# Patient Record
Sex: Female | Born: 1990 | Hispanic: Yes | Marital: Single | Smoking: Never smoker
Health system: Southern US, Community
[De-identification: ages and names within clinical notes are randomized; demographics above are authoritative.]

## PROBLEM LIST (undated history)

## (undated) DIAGNOSIS — Z789 Other specified health status: Secondary | ICD-10-CM

## (undated) HISTORY — PX: NO PAST SURGERIES: SHX2092

---

## 2010-05-19 HISTORY — PX: OTHER SURGICAL HISTORY: SHX169

## 2011-03-16 ENCOUNTER — Inpatient Hospital Stay (HOSPITAL_COMMUNITY): Payer: Self-pay

## 2011-03-16 ENCOUNTER — Inpatient Hospital Stay (HOSPITAL_COMMUNITY)
Admission: AD | Admit: 2011-03-16 | Discharge: 2011-03-16 | Disposition: A | Discharge status: Home / Self Care | Payer: Self-pay | Source: Ambulatory Visit | Attending: Obstetrics & Gynecology | Admitting: Obstetrics & Gynecology

## 2011-03-16 ENCOUNTER — Encounter (HOSPITAL_COMMUNITY): Payer: Self-pay | Admitting: *Deleted

## 2011-03-16 DIAGNOSIS — O2390 Unspecified genitourinary tract infection in pregnancy, unspecified trimester: Secondary | ICD-10-CM | POA: Insufficient documentation

## 2011-03-16 DIAGNOSIS — N760 Acute vaginitis: Secondary | ICD-10-CM | POA: Insufficient documentation

## 2011-03-16 DIAGNOSIS — O209 Hemorrhage in early pregnancy, unspecified: Secondary | ICD-10-CM | POA: Insufficient documentation

## 2011-03-16 DIAGNOSIS — B9689 Other specified bacterial agents as the cause of diseases classified elsewhere: Secondary | ICD-10-CM | POA: Insufficient documentation

## 2011-03-16 DIAGNOSIS — A499 Bacterial infection, unspecified: Secondary | ICD-10-CM | POA: Insufficient documentation

## 2011-03-16 LAB — CBC
MCHC: 35.3 g/dL (ref 30.0–36.0)
Platelets: 317 10*3/uL (ref 150–400)
RDW: 12.2 % (ref 11.5–15.5)
WBC: 12.5 10*3/uL — ABNORMAL HIGH (ref 4.0–10.5)

## 2011-03-16 LAB — HCG, QUANTITATIVE, PREGNANCY: hCG, Beta Chain, Quant, S: 2761 m[IU]/mL — ABNORMAL HIGH (ref <5)

## 2011-03-16 LAB — WET PREP, GENITAL
Trich, Wet Prep: NONE SEEN
Yeast Wet Prep HPF POC: NONE SEEN

## 2011-03-16 MED ORDER — METRONIDAZOLE 500 MG PO TABS: 500.0000 mg | ORAL_TABLET | Freq: Two times a day (BID) | ORAL | Status: AC

## 2011-03-16 NOTE — Progress Notes (Signed)
Pt reports per interpreter, that this am she had a brownish discharge, nothing after that until tonight when she noticed a drop of blood. Pt reports positive preg  Test on 03/13/2011, LMP 12/27/2010. Pt denies pain. Pt is going to go thru [facility] for prenatal care

## 2011-03-16 NOTE — ED Provider Notes (Signed)
Chief Complaint:  Vaginal Bleeding and Possible Pregnancy   Molly Carter is  20 y.o. G1P0.  Patient's last menstrual period was 12/27/2010.Marland Kitchen  Her pregnancy status is positive. Presents with proof of preg letter from Dr. Jearl Klinefelter office. [redacted]w[redacted]d by LMP She presents complaining of Vaginal Bleeding and Possible Pregnancy . Onset is described as sudden and has been present for today. Reports pink/red spotting on tissue only after voiding. Denies abd pain, back pain and dysuria. Last intercourse yesterday.  Obstetrical/Gynecological History: OB History    Grav Para Term Preterm Abortions TAB SAB Ect Mult Living   1               Past Medical History: No past medical history on file.  Past Surgical History: No past surgical history on file.  Family History: No family history on file.  Social History: History  Substance Use Topics  . Smoking status: Not on file  . Smokeless tobacco: Not on file  . Alcohol Use: Not on file    Allergies: No Known Allergies  No prescriptions prior to admission    Review of Systems - Negative except what has been reviewed in the HPI  Physical Exam   Blood pressure 140/79, pulse 102, temperature 97.9 F (36.6 C), resp. rate 16, height 5\' 3"  (1.6 m), weight 74.39 kg (164 lb), last menstrual period 12/27/2010, SpO2 99.00%.  General: General appearance - alert, well appearing, and in no distress, oriented to person, place, and time and overweight Mental status - alert, oriented to person, place, and time, normal mood, behavior, speech, dress, motor activity, and thought processes, affect appropriate to mood Abdomen - soft, nontender, nondistended, no masses or organomegaly Focused Gynecological Exam: VULVA: normal appearing vulva with no masses, tenderness or lesions, VAGINA: vaginal discharge - scant brown disharge, CERVIX: normal appearing cervix without discharge or lesions, UTERUS: enlarged to 7-8 week's size, ADNEXA: normal adnexa in  size, nontender and no masses  Labs: Recent Results (from the past 24 hour(s))  HCG, QUANTITATIVE, PREGNANCY   Collection Time   03/16/11  9:50 PM      Component Value Range   hCG, Beta Chain, Quant, S 2761 (*) <5 (mIU/mL)  CBC   Collection Time   03/16/11  9:50 PM      Component Value Range   WBC 12.5 (*) 4.0 - 10.5 (K/uL)   RBC 4.10  3.87 - 5.11 (MIL/uL)   Hemoglobin 13.3  12.0 - 15.0 (g/dL)   HCT 16.1  09.6 - 04.5 (%)   MCV 92.0  78.0 - 100.0 (fL)   MCH 32.4  26.0 - 34.0 (pg)   MCHC 35.3  30.0 - 36.0 (g/dL)   RDW 40.9  81.1 - 91.4 (%)   Platelets 317  150 - 400 (K/uL)  ABO/RH   Collection Time   03/16/11  9:56 PM      Component Value Range   ABO/RH(D) O POS    WET PREP, GENITAL   Collection Time   03/16/11 10:10 PM      Component Value Range   Yeast, Wet Prep NONE SEEN  NONE SEEN    Trich, Wet Prep NONE SEEN  NONE SEEN    Clue Cells, Wet Prep MODERATE (*) NONE SEEN    WBC, Wet Prep HPF POC MANY (*) NONE SEEN    Imaging Studies:  RADIOLOGY REPORT*  Clinical Data: Bleeding. Gestational age by LMP 11-week-2-day  OBSTETRIC <14 WK [place] AND TRANSVAGINAL OB US  Technique: Both transabdominal and transvaginal  ultrasound  examinations were performed for complete evaluation of the  gestation as well as the maternal uterus, adnexal regions, and  pelvic cul-de-sac. Transvaginal technique was performed to assess  early pregnancy.  Comparison: None.  Intrauterine gestational sac: Irregular shape gestational sac  containing echoes in the fluid.  Yolk sac: not visualized  Embryo: Not visualized  Cardiac Activity: Not visualized  MSD: 33.7 mm mm 8 w 6 d  CRL: mm w d [place] EDC:  Maternal uterus/adnexae:  Small amount of free fluid. Corpus luteum cyst right ovary.  Ovaries are normal in size.  IMPRESSION:  Gestational sac shape is irregular with internal echoes. No yolk  sac or fetal pole. Findings suggest fetal demise or blighted ovum.  Follow-up Beta HCG is suggested.    Original Report Authenticated By: Camelia Phenes, M.D.    Assessment: Bleeding in early pregnancy BV  Plan: Discharge home Rx Flagyl FU 48 hours for repeat quant Bleeding/ectopic precautions reviewed  Krystofer Hevener E. 03/16/2011,10:52 PM

## 2011-03-16 NOTE — Progress Notes (Signed)
Pt woke up this morning with brown discharge and then it turned pink this afternoon.  States she had intercourse yesterday.  Denies any pain with urination; no cramping.

## 2011-03-18 ENCOUNTER — Encounter (HOSPITAL_COMMUNITY): Payer: Self-pay | Admitting: *Deleted

## 2011-03-18 ENCOUNTER — Inpatient Hospital Stay (HOSPITAL_COMMUNITY)
Admission: AD | Admit: 2011-03-18 | Discharge: 2011-03-18 | Disposition: A | Discharge status: Home / Self Care | Payer: Self-pay | Source: Ambulatory Visit | Attending: Obstetrics and Gynecology | Admitting: Obstetrics and Gynecology

## 2011-03-18 ENCOUNTER — Inpatient Hospital Stay (HOSPITAL_COMMUNITY): Payer: Self-pay

## 2011-03-18 ENCOUNTER — Ambulatory Visit (HOSPITAL_COMMUNITY): Payer: Self-pay

## 2011-03-18 ENCOUNTER — Other Ambulatory Visit: Payer: Self-pay | Admitting: Obstetrics and Gynecology

## 2011-03-18 DIAGNOSIS — O021 Missed abortion: Secondary | ICD-10-CM

## 2011-03-18 DIAGNOSIS — O209 Hemorrhage in early pregnancy, unspecified: Secondary | ICD-10-CM

## 2011-03-18 DIAGNOSIS — O039 Complete or unspecified spontaneous abortion without complication: Secondary | ICD-10-CM | POA: Insufficient documentation

## 2011-03-18 HISTORY — DX: Other specified health status: Z78.9

## 2011-03-18 MED ORDER — IBUPROFEN 600 MG PO TABS: 600.0000 mg | ORAL_TABLET | Freq: Four times a day (QID) | ORAL | Status: AC | PRN

## 2011-03-18 NOTE — ED Provider Notes (Signed)
History   Chief Complaint:  Vaginal Bleeding   Molly Carter is  20 y.o. G1P0 Patient's last menstrual period was 12/27/2010.Marland Kitchen Patient is here for follow up of quantitative HCG and ongoing surveillance of pregnancy status.   She is [redacted]w[redacted]d weeks gestation by LMP.    Since her last visit, the patient is with new complaint.   The patient reports increased low abd pain that prompted her to return to [facility]. She started bleeding a moderate amount of bright red blood and passed one medium clot.  Pain significantly improved immediately afterward.  General ROS:  negative  Her previous Quantitative HCG values are HCG, QUANTITATIVE, PREGNANCY   Collection Time   03/16/11  9:50 PM      Component Value Range   hCG, Beta Chain, Quant, S 2761 (*) <5 (mIU/mL)   HCG, QUANTITATIVE, PREGNANCY   Collection Time   03/18/11  5:32 AM      Component Value Range   hCG, Beta Chain, Quant, S 1550 (*) <5 (mIU/mL)   Physical Exam   Blood pressure 133/82, pulse 115, temperature 99.3 F (37.4 C), resp. rate 20, last menstrual period 12/27/2010.  Focused Gynecological Exam: exam declined by the patient  Ultrasound Studies:   US Ob Transvaginal  03/18/2011  *RADIOLOGY REPORT*  Clinical Data: Heavy bleeding  TRANSVAGINAL OB ULTRASOUND  Technique:  Transvaginal ultrasound was performed for evaluation of the gestation as well as the maternal uterus and adnexal regions.  Comparison: 03/16/2011  Findings: The uterus measures 5.5 x 9.6 by 6.9 cm.  The endometrium is thickened and heterogeneous in echotexture, measuring about 1.5 cm thickness.  The previously identified endometrial gestational sac is not visualized today.  Changes are consistent with ongoing or interval spontaneous abortion.  Color flow Doppler images demonstrate minimal if any flow in the endometrium, suggesting no specific evidence of retained products of conception.  No myometrial masses are suggested.  No free pelvic fluid collections.  The  right ovary measures 3.5 by 2.6 by 2.8 cm and contains a simple cyst measuring 1.9 x 1.4 cm.  The left ovary measures 2.7 x 1.4 by 1.9 cm.  Normal follicular changes are suggested.  No abnormal adnexal masses.  IMPRESSION: Previously identified intrauterine gestational sac is no longer visualized.  The endometrium is heterogeneous and thickened. Changes are consistent with ongoing/interval spontaneous abortion.  Original Report Authenticated By: Marlon Pel, M.D.   Possible GS passed while in [place].   Assessment: 1. Completed SAB, bleeding stable  Plan: 1. The patient is instructed to follow PRN for heavy bleeding or fever >100.4. 2. Support given 3. Preconception counseling done. 4. POC to pathology    Molly Carter 03/18/2011, 5:49 AM

## 2011-03-18 NOTE — Progress Notes (Signed)
Pt presents and per interpreter complains of increasing pain in lower abd tonight, was seen 10/28 for bleeding and no pain, was told to return on 10/31 for repeat labs. enroute here began to have heavy bleeding

## 2011-03-18 NOTE — ED Provider Notes (Signed)
Agree with above note.  Molly Carter H. 03/18/2011 7:50 PM

## 2011-03-20 NOTE — ED Provider Notes (Signed)
Agree with above note.  Molly Carter 03/20/2011 3:41 PM

## 2012-03-29 ENCOUNTER — Ambulatory Visit: Payer: Self-pay | Admitting: Family Medicine

## 2012-03-29 VITALS — BP 135/75 | HR 79 | Temp 97.8°F | Resp 16 | Ht 64.0 in | Wt 157.0 lb

## 2012-03-29 DIAGNOSIS — W57XXXA Bitten or stung by nonvenomous insect and other nonvenomous arthropods, initial encounter: Secondary | ICD-10-CM

## 2012-03-29 DIAGNOSIS — T148 Other injury of unspecified body region: Secondary | ICD-10-CM

## 2012-03-29 MED ORDER — HYDROXYZINE HCL 25 MG PO TABS: 25.0000 mg | ORAL_TABLET | Freq: Three times a day (TID) | ORAL | Status: DC | PRN

## 2012-03-29 MED ORDER — TRIAMCINOLONE ACETONIDE 0.1 % EX CREA: TOPICAL_CREAM | Freq: Two times a day (BID) | CUTANEOUS | Status: DC

## 2012-03-29 NOTE — Patient Instructions (Signed)
Bedbugs Bedbugs are tiny bugs that live in and around beds. During the day, they hide in mattresses and other places near beds. They come out at night and bite people lying in bed. They need blood to live and grow. Bedbugs can be found in beds anywhere. Usually, they are found in places where many people come and go (hotels, shelters, hospitals). It does not matter whether the place is dirty or clean. Getting bitten by bedbugs rarely causes a medical problem. The biggest problem can be getting rid of them. This often takes the work of a pest control expert. CAUSES  Less use of pesticides. Bedbugs were common before the 1950s. Then, strong pesticides such as DDT nearly wiped them out. Today, these pesticides are not used because they harm the environment and can cause health problems.  More travel. Besides mattresses, bedbugs can also live in clothing and luggage. They can come along as people travel from place to place. Bedbugs are more common in certain parts of the world. When people travel to those areas, the bugs can come home with them.  Presence of birds and bats. Bedbugs often infest birds and bats. If you have these animals in or near your home, bedbugs may infest your house, too. SYMPTOMS It does not hurt to be bitten by a bedbug. You will probably not wake up when you are bitten. Bedbugs usually bite areas of the skin that are not covered. Symptoms may show when you wake up, or they may take a day or more to show up. Symptoms may include:  Small red bumps on the skin. These might be lined up in a row or clustered in a group.  A darker red dot in the middle of red bumps.  Blisters on the skin. There may be swelling and very bad itching. These may be signs of an allergic reaction. This does not happen often. DIAGNOSIS Bedbug bites might look and feel like other types of insect bites. The bugs do not stay on the body like ticks or lice. They bite, drop off, and crawl away to hide. Your  caregiver will probably:  Ask about your symptoms.  Ask about your recent activities and travel.  Check your skin for bedbug bites.  Ask you to check at home for signs of bedbugs. You should look for:  Spots or stains on the bed or nearby. This could be from bedbugs that were crushed or from their eggs or waste.  Bedbugs themselves. They are reddish-brown, oval, and flat. They do not fly. They are about the size of an apple seed.  Places to look for bedbugs include:  Beds. Check mattresses, headboards, box springs, and bed frames.  On drapes and curtains near the bed.  Under carpeting in the bedroom.  Behind electrical outlets.  Behind any wallpaper that is peeling.  Inside luggage. TREATMENT Most bedbug bites do not need treatment. They usually go away on their own in a few days. The bites are not dangerous. However, treatment may be needed if you have scratched so much that your skin has become infected. You may also need treatment if you are allergic to bedbug bites. Treatment options include:  A drug that stops swelling and itching (corticosteroid). Usually, a cream is rubbed on the skin. If you have a bad rash, you may be given a corticosteroid pill.  Oral antihistamines. These are pills to help control itching.  Antibiotic medicines. An antibiotic may be prescribed for infected skin. HOME CARE INSTRUCTIONS     Take any medicine prescribed by your caregiver for your bites. Follow the directions carefully.  Consider wearing pajamas with long sleeves and pant legs.  Your bedroom may need to be treated. A pest control expert should make sure the bedbugs are gone. You may need to throw away mattresses or luggage. Ask the pest control expert what you can do to keep the bedbugs from coming back. Common suggestions include:  Putting a plastic cover over your mattress.  Washing and drying your clothes and bedding in hot water and a hot dryer. The temperature should be hotter  than 120 F (48.9 C). Bedbugs are killed by high temperatures.  Vacuuming carefully all around your bed. Vacuum in all cracks and crevices where the bugs might hide. Do this often.  Carefully checking all used furniture, bedding, or clothes that you bring into your house.  Eliminating bird nests and bat roosts.  If you get bedbug bites when traveling, check all your possessions carefully before bringing them into your house. If you find any bugs on clothes or in your luggage, consider throwing those items away. SEEK MEDICAL CARE IF:  You have red bug bites that keep coming back.  You have red bug bites that itch badly.  You have bug bites that cause a skin rash.  You have scratch marks that are red and sore. SEEK IMMEDIATE MEDICAL CARE IF: You have a fever. Document Released: 06/07/2010 Document Revised: 07/28/2011 Document Reviewed: 06/07/2010 [facility] Patient Information 2013 [place], [place]. Chinches (Bedbugs)  Las chinches son insectos pequeos que viven en y alrededor de las camas. Durante el da se ocultan en colchones y otros lugares cerca de las camas. Por la noche salen y pican a los que duermen. Necesitan sangre para vivir y Engineer, building services. Pueden encontrarse en las camas de Corporate treasurer. Por lo general, se encuentran en lugares donde muchas personas Zenaida Niece y vienen (hoteles, albergues, hospitales). No importa si el lugar est sucio o limpio.  La picadura de chinches rara vez causa problemas de [place]. El mayor problema es deshacerse de ellos.  Generalmente requiere la intervencin de un experto en control de plagas. CAUSAS   Menos uso de pesticidas. Las chinches eran una plaga comn antes de la dcada de 1950. En ese entonces, los pesticidas fuertes, como el DDT estuvo a punto eliminarlas. Actualmente ya no se [place] plaguicidas, ya que daan [address] y pueden causar problemas de [place].   Se viaja ms. Adems de los colchones, las chinches tambin pueden vivir en  la ropa y el equipaje. Pueden venir con las personas que viajan de Administrator a Therapist, art. Las chinches son ms comunes en ciertas partes del mundo. Cuando la gente viaja a esas zonas, los insectos pueden volver a casa con ellos.   Presencia de aves y Regulatory affairs officer. Las chinches suelen infestar a las aves y Sales executive. Si usted tiene [employer] alrededores de su casa, las chinches pueden infestar su casa, [place].  SNTOMAS  La picadura de una chinche no duele. Probablemente no lo despierte. Las chinches pican las reas de la piel descubiertas. Los sntomas pueden aparecer al despertar, o despus de uno o ms das. Los sntomas pueden ser:   Pequeos bultos rojos en la piel. Estos pueden ser [employer] fila o agrupado.   Un punto rojo ms oscuro en el centro de protuberancias rojas.   Ampollas en la piel. Puede haber inflamacin y picazn muy intensa. Estos pueden ser sntomas de Runner, broadcasting/film/video. Esto  no sucede a menudo.  DIAGNSTICO  Las picaduras de chinches puede verse y sentirse como otros tipos de picaduras de insectos. Los insectos no se quedan adheridos al cuerpo, [employer] garrapatas o piojos. Muerden, se caen, y se arrastran a esconderse. Su mdico probablemente har lo siguiente:   Economist de sus sntomas.   Preguntar sobre sus actividades recientes y los viajes.   Reviser en su piel las picaduras.   Le pedir que investigue en su casa en busca de signos de chinches. Usted debe buscar:   Manchas o puntos en la cama o en los alrededores. Pueden ser las chinches aplastadas, sus huevos o los desechos.   Las [employer]. Son de color marrn rojizo, forma ovalada y plana. No vuelan Son aproximadamente del [address] de [place] semilla de [place].   Los lugares para buscar chinches son:   Camas. Revise colchones, cabeceras, somiers y [employer] de la cama.   Cortinados en los alrededores de la cama.   Debajo de la alfombra del dormitorio.   Detrs de los  [employer].   Detrs del papel pintado que se est despegando.   Dentro de equipaje.  TRATAMIENTO  La mayora de las picaduras no necesitan antibiticos. Desaparecen sin tratamiento luego de [employer]. Las picaduras no son peligrosas. Sin embargo, puede ser necesario un tratamiento si usted se ha rascado tanto que su piel se ha infectado. Tambin puede necesitar un tratamiento si es alrgico a las picaduras de las chinches. Las opciones de tratamiento son:   Un medicamento que detiene la inflamacin y Higher education careers adviser (corticoides). Por lo general, se frota una crema sobre la piel. Si usted tiene una erupcin grave, podrn recetarle una pastilla de corticooides.   Antihistamnicos por va oral. Estas pastillas ayudan a controlar la picazn.   Antibiticos. Si la piel se infecta le recetarn antibiticos.  INSTRUCCIONES PARA EL CUIDADO EN EL HOGAR   Tome los medicamentos como le indic el mdico. Siga cuidadosamente las indicaciones.   Use pijamas con mangas y piernas largas.   Podra ser necesario un tratamiento en la habitacin. Un experto en plagas deber verificar que las chinches han desaparecido. Puede ser que deba desechar el colchn. Consulte con un experto qu puede hacer para prevenir que vuelva la plaga. Entre las sugerencias ms comunes se incluyen:   Coloque una cubierta plstica sobre el colchn.   Lave y seque sus ropas y ropa de cama en agua caliente y en secador con calor. La temperatura debe ser de ms de 120 F (48.9 C). Las altas [employer] las chinches.   Pase la aspiradora cuidadosamente alrededor [employer]. Aspire en todas las grietas y escondites en que las chinches puedan ocultarse. Hgalo con frecuencia.   Controle todos los [place], camas o ropa de cama usados antes de llevarlos a su casa.   Elimine los nidos de pjaros y perchas de los murcilagos.   Si las chinches lo pican durante un viaje controle todas sus pertenencias con cuidados antes de  llevarlas a su casa. Si encuentra chinches en la ropa o en el equipaje, considere la posibilidad de desecharlos.  SOLICITE ATENCIN MDICA SI:   Tiene picaduras rojas que vuelven a aparecer.   Siente una picazn intensa en las ronchas.   Las picaduras le causan una erupcin en la piel.   Tiene marcas de rascado que estn rojas y Secretary/administrator.  SOLICITE ATENCIN MDICA DE INMEDIATO SI:  Tiene fiebre.  Document Released: 01/15/2011 Document Revised: 04/24/2011 ExitCare  Patient Information 2012 [employer]. 

## 2012-03-29 NOTE — Progress Notes (Signed)
  Subjective:    Patient ID: Molly Carter, female    DOB: 05/14/1991, 21 y.o.   MRN: [id number]  HPI Has had intermittent rash x 2 yrs.  Has tried permethrin once and hydrocortisone which helped ot control the itching but it didn't really solve the problem.  Is really concerned that now she is getting permanent dark spots on the extensor surface of her arms so wants something to lighten the spots.  Is also over her thighs and some spots on her chest/upper back Was told that it was scabies once so washed everything in hot water when she did the permetherin but didn't seem to help.  She is here w/ a friend who sleeps over sometimes and the friend has never gotten the itchy rash or noone else in the phone. Past Medical History  Diagnosis Date  . No pertinent past medical history    No h/o asthma or allergies Review of Systems  Constitutional: Negative for fever, chills and diaphoresis.  Musculoskeletal: Negative for joint swelling and arthralgias.  Skin: Positive for color change and rash. Negative for pallor and wound.  Hematological: Negative for adenopathy. Does not bruise/bleed easily.  Psychiatric/Behavioral: Negative for sleep disturbance.      BP 135/75  Pulse 79  Temp 97.8 F (36.6 C) (Oral)  Resp 16  Ht 5\' 4"  (1.626 m)  Wt 157 lb (71.215 kg)  BMI 26.95 kg/m2  SpO2 100%  LMP 03/17/2012  Breastfeeding? Unknown Objective:   Physical Exam  Constitutional: She is oriented to person, place, and time. She appears well-developed and well-nourished. No distress.  HENT:  Head: Normocephalic and atraumatic.  Eyes: Conjunctivae normal are normal. No scleral icterus.  Pulmonary/Chest: Effort normal.  Musculoskeletal: She exhibits no edema.  Neurological: She is alert and oriented to person, place, and time.  Skin: Skin is warm and dry. Lesion and rash noted. She is not diaphoretic.       Many hyperpigemted macules and excoriated papules over bilateral extensor surface of  arms around elbow  Psychiatric: She has a normal mood and affect. Her behavior is normal.          Assessment & Plan:   1. Bedbug bite  triamcinolone cream (KENALOG) 0.1 %, hydrOXYzine (ATARAX/VISTARIL) 25 MG tablet  Gave pt home info on how to look for and rid living place of bedbugs.  Top steroid will at least help with the itching and may also lighten hyperpigmented macules which I suspect are from recurrent inflammation from excoriation.

## 2012-05-19 NOTE — L&D Delivery Note (Signed)
Delivery Note At 2:17 AM a viable female was delivered via Vaginal, Spontaneous Delivery (Presentation: ; Occiput Anterior).  APGAR: 9, 9; weight .   Placenta status: Intact, Spontaneous.  Cord: 3 vessels with the following complications: None.  Cord pH: not done  Anesthesia: Epidural  Episiotomy: None Lacerations: 1st degree;Sulcus;Perineal Suture Repair: 2.0 vicryl Est. Blood Loss (mL): 250  Mom to postpartum.  Baby to Couplet care / Skin to Skin.  MARSHALL,BERNARD A 05/10/2013, 2:39 AM

## 2012-06-21 IMAGING — US US OB TRANSVAGINAL
1 series · 14 of 28 positions shown · non-contrast
Comparison: None.

CLINICAL DATA: Bleeding.  Gestational age by LMP 11-week-1-day

OBSTETRIC <14 WK US AND TRANSVAGINAL OB US
TECHNIQUE: Both transabdominal and transvaginal ultrasound
examinations were performed for complete evaluation of the
gestation as well as the maternal uterus, adnexal regions, and
pelvic cul-de-sac.  Transvaginal technique was performed to assess
early pregnancy.

[Series 1: us ob comp less 14 wks · 14 of 38 slices shown]
[im 2/38]
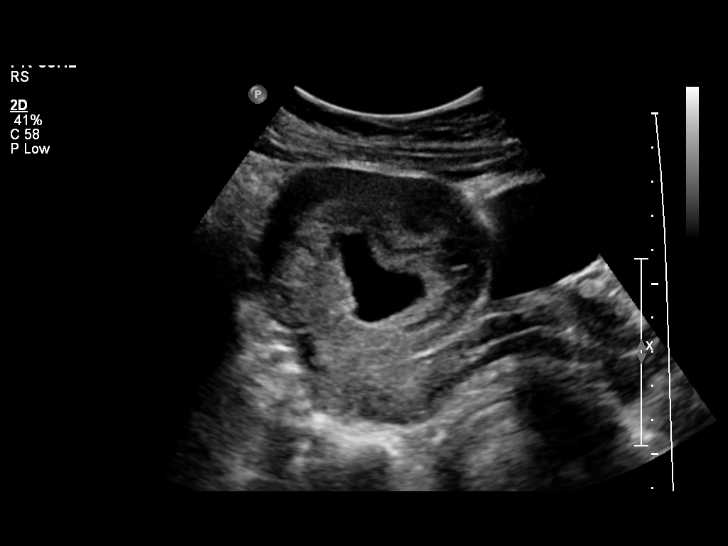
[im 5/38]
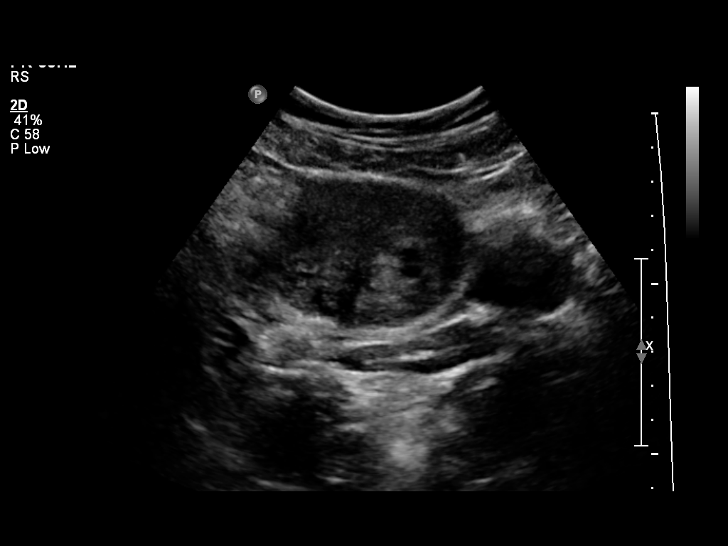
[im 7/38]
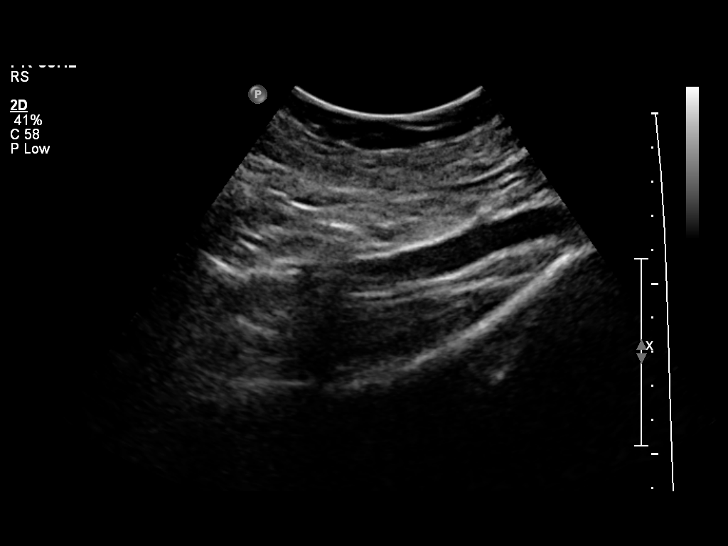
[im 10/38]
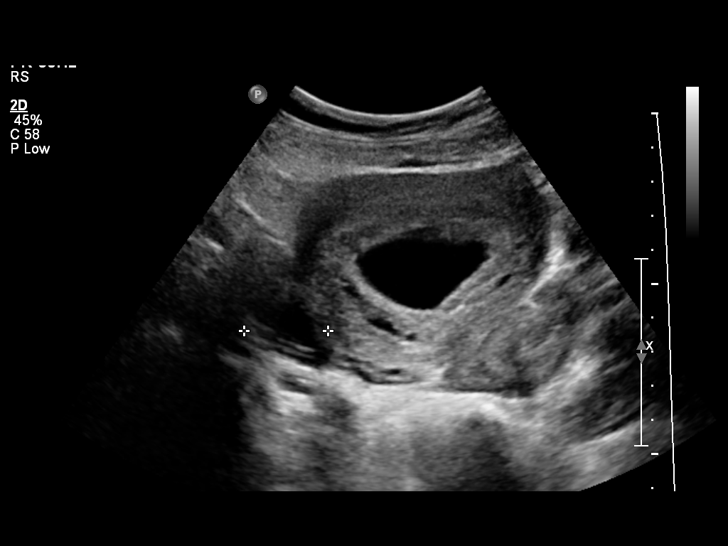
[im 13/38]
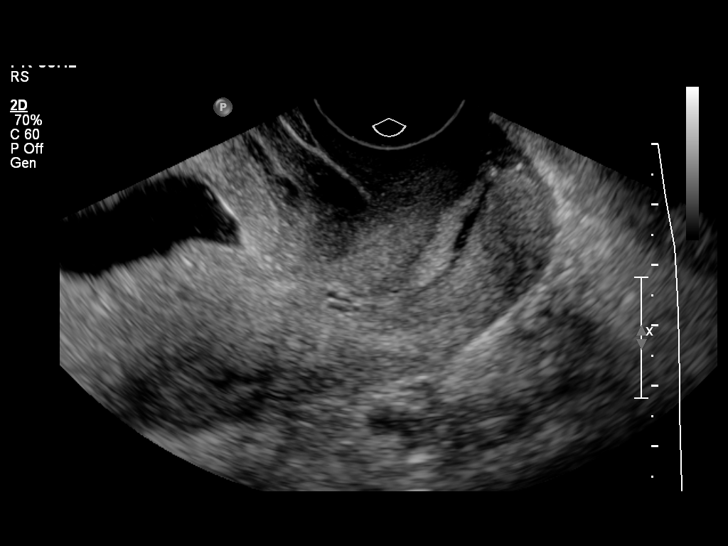
[im 16/38]
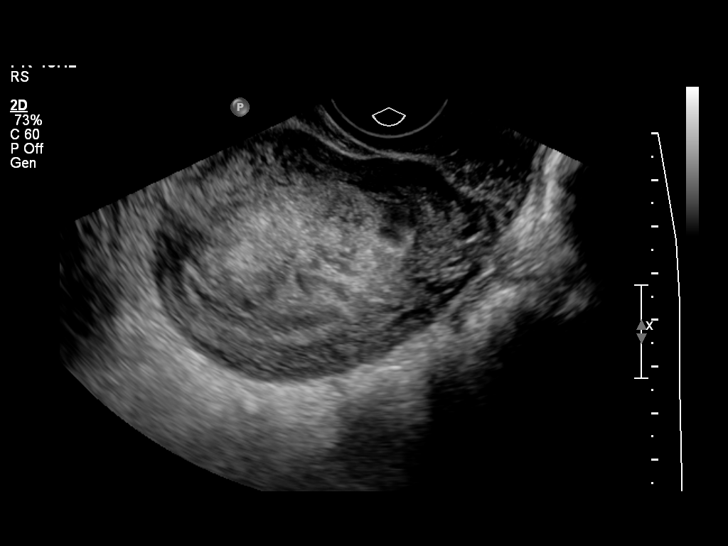
[im 18/38]
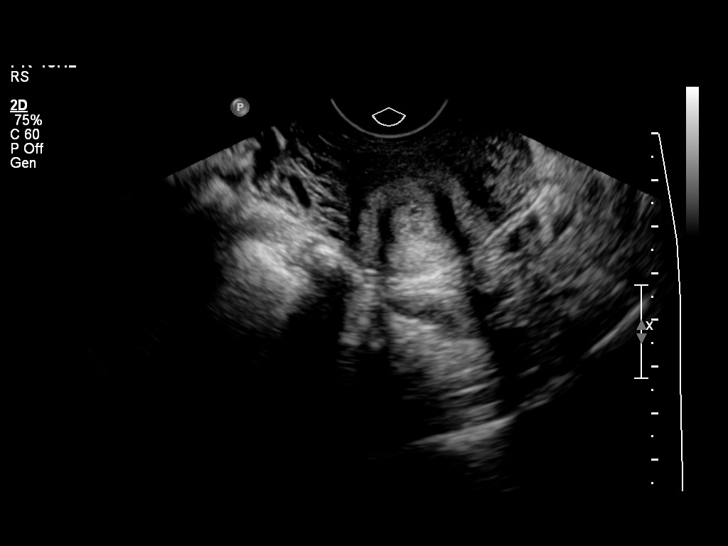
[im 21/38]
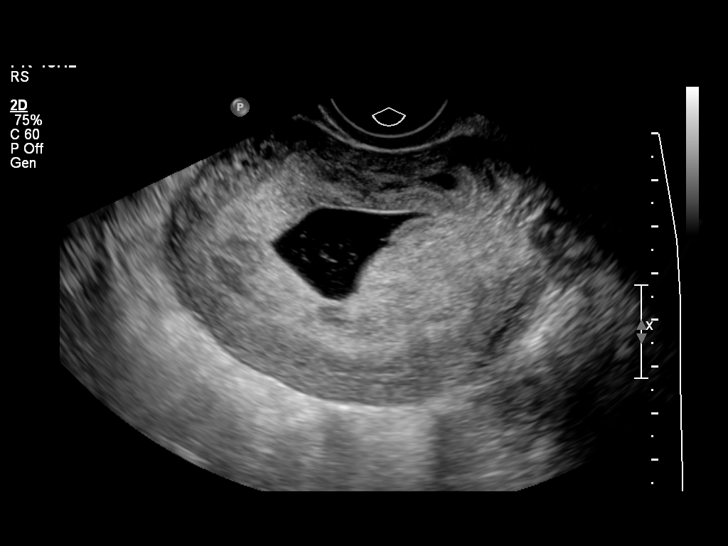
[im 24/38]
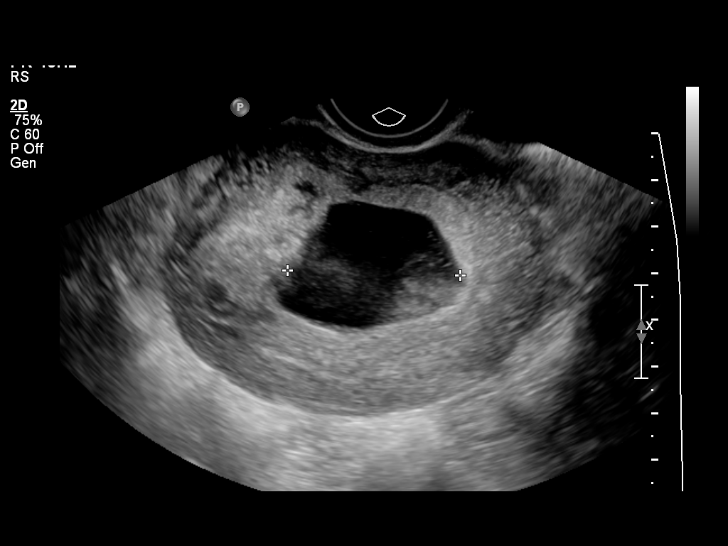
[im 27/38]
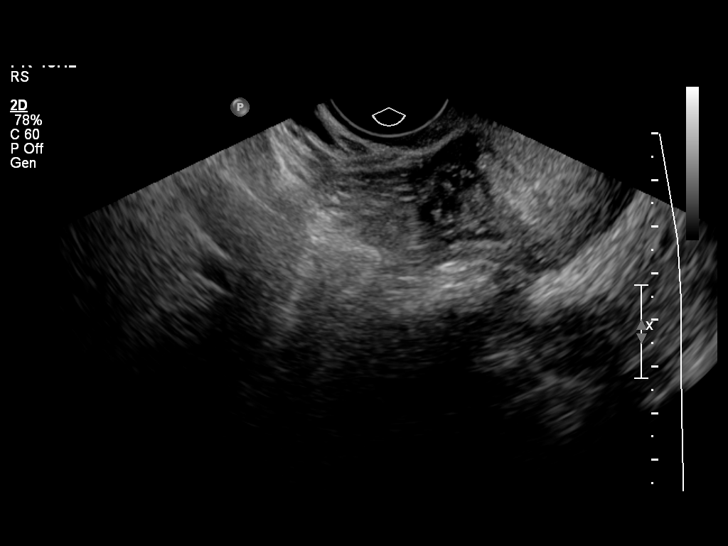
[im 29/38]
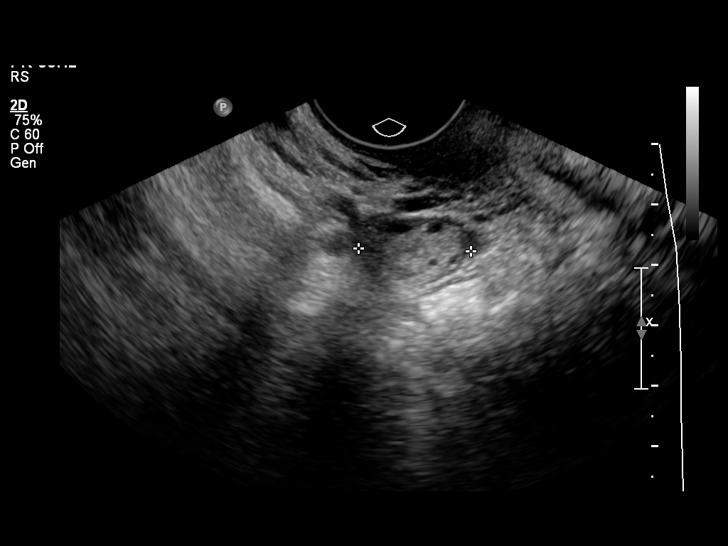
[im 32/38]
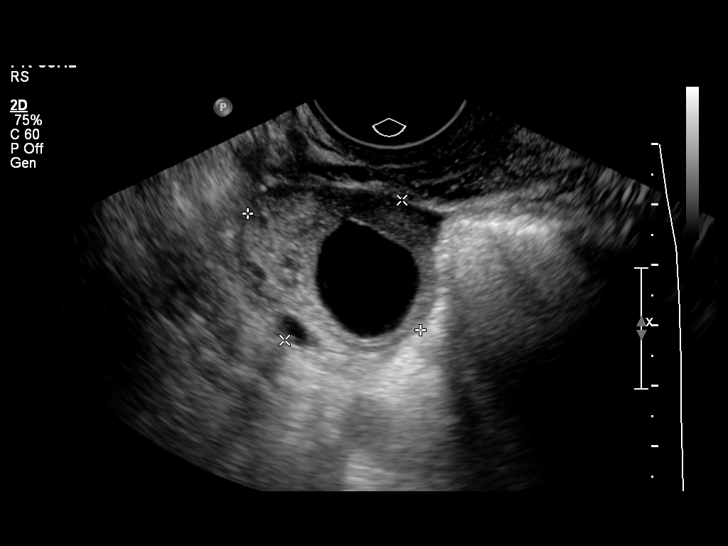
[im 35/38]
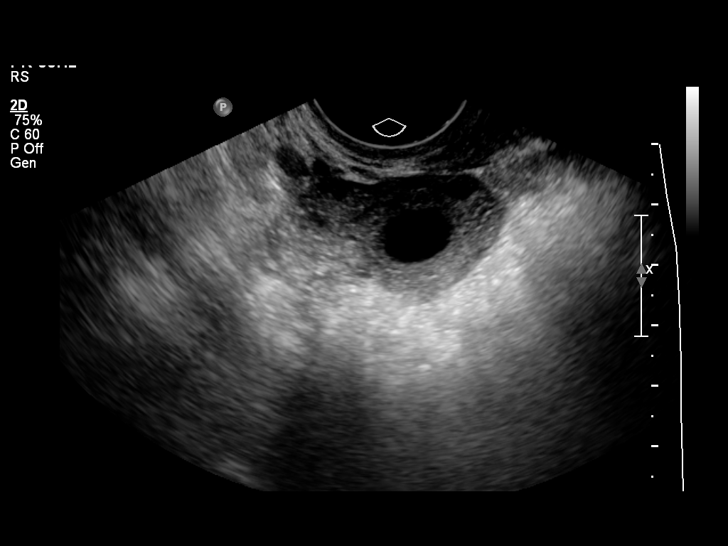
[im 38/38]
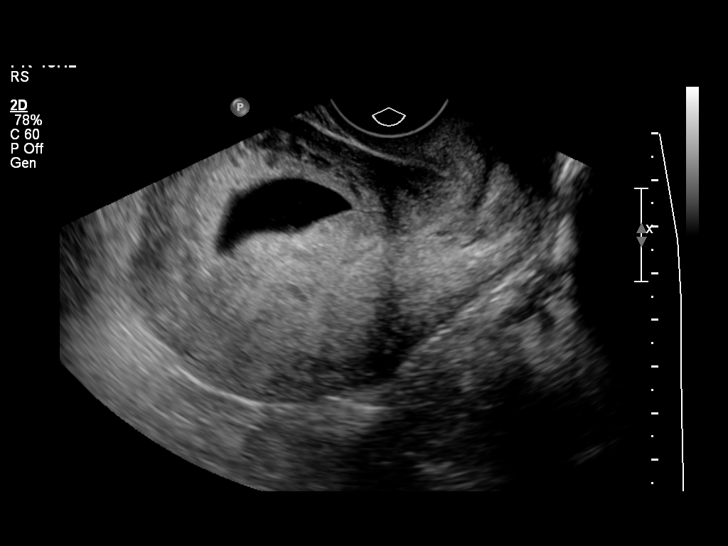

[14 of 28 positions shown; findings below may reference images not displayed]

Intrauterine gestational sac:  Irregular shape gestational sac
containing echoes  in the fluid.
Yolk sac:   not visualized
Embryo: Not visualized
Cardiac Activity: Not visualized

MSD: 33.7 mm  mm  8    w 6    d
CRL:    mm     w     d        US EDC:

Maternal uterus/adnexae:
Small amount of free fluid.  Corpus luteum cyst right ovary.
Ovaries are normal in size.
IMPRESSION: Gestational sac shape is irregular with internal echoes.  No yolk
sac or fetal pole.  Findings suggest fetal demise or blighted ovum.
Follow-up Beta HCG is suggested.

## 2012-06-23 IMAGING — US US OB TRANSVAGINAL
1 series · 14 of 28 positions shown · non-contrast
Comparison: 03/16/2011

CLINICAL DATA: Heavy bleeding

TRANSVAGINAL OB ULTRASOUND
TECHNIQUE: Transvaginal ultrasound was performed for evaluation of
the gestation as well as the maternal uterus and adnexal regions.

[Series 1: us ob transvaginal · 14 of 42 slices shown]
[im 2/42]
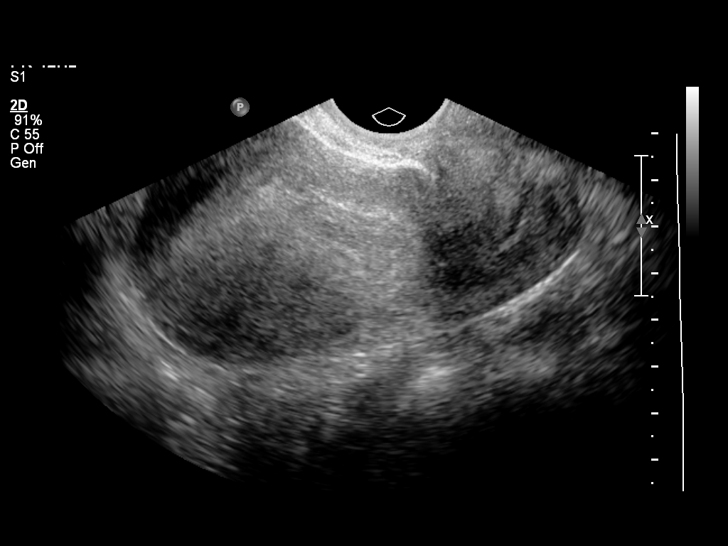
[im 5/42]
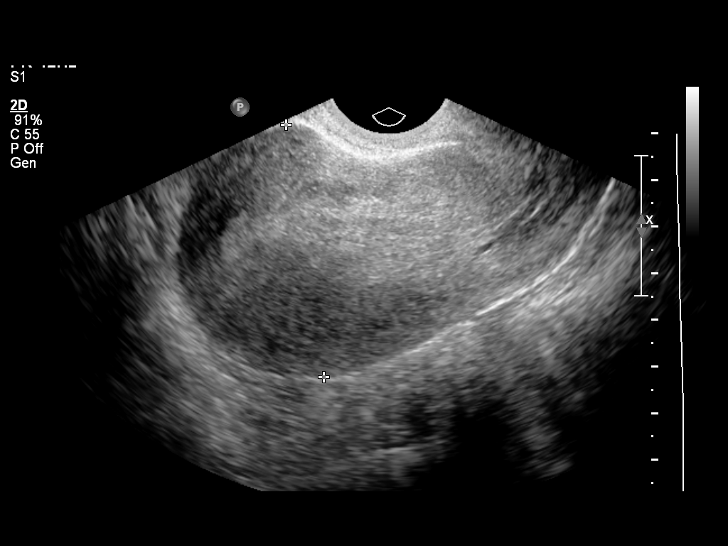
[im 8/42]
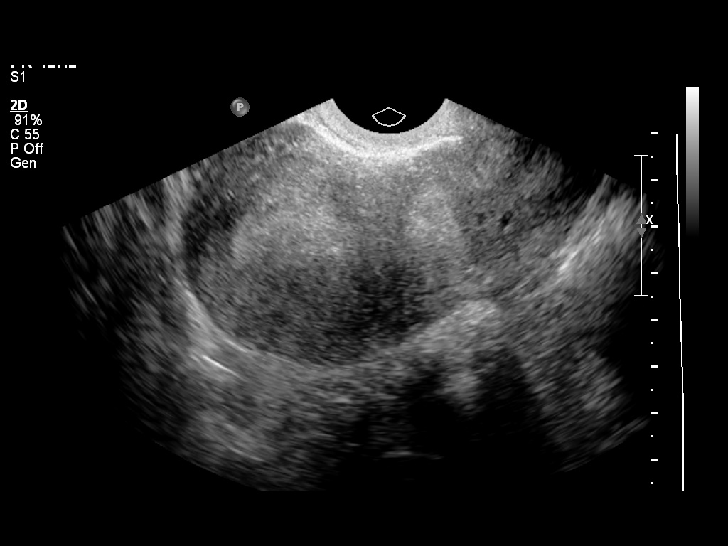
[im 11/42]
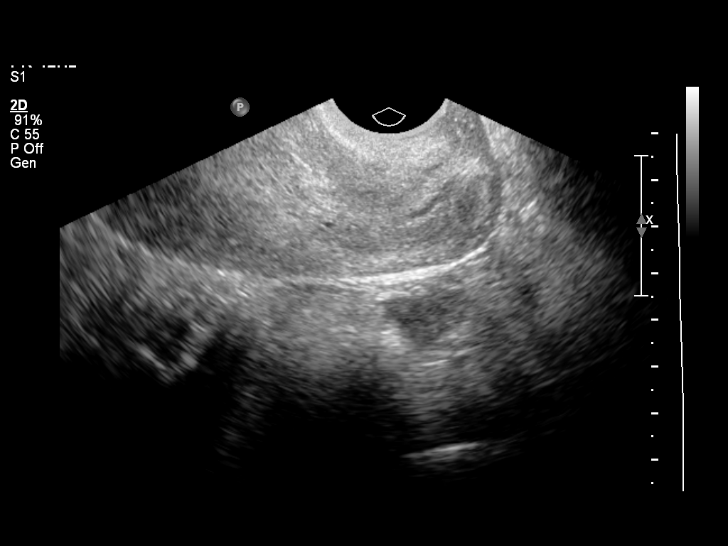
[im 14/42]
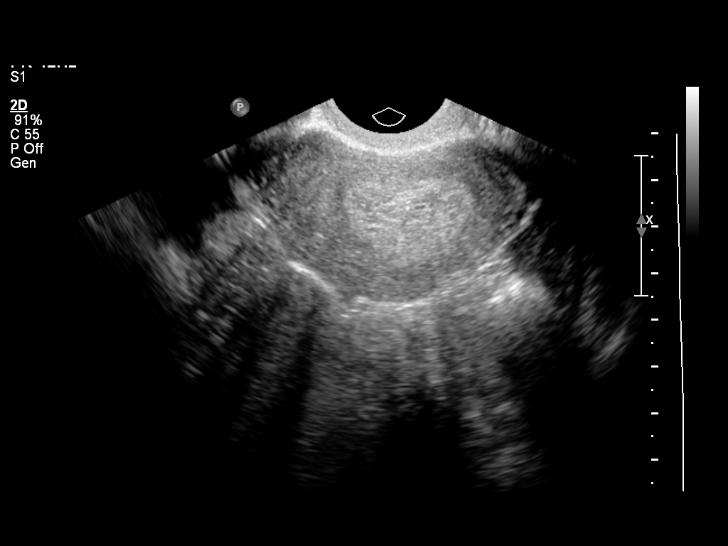
[im 17/42]
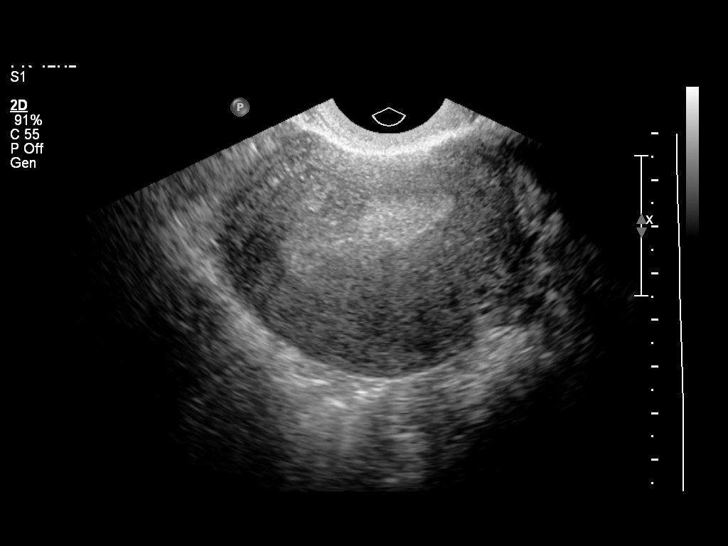
[im 20/42]
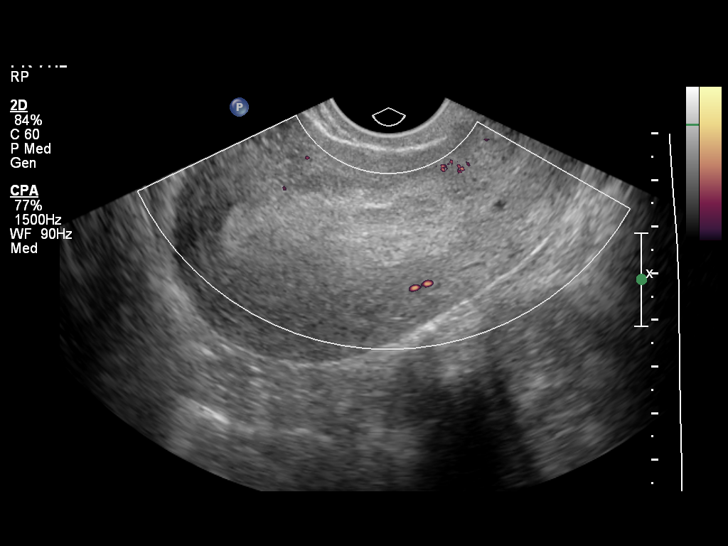
[im 23/42]
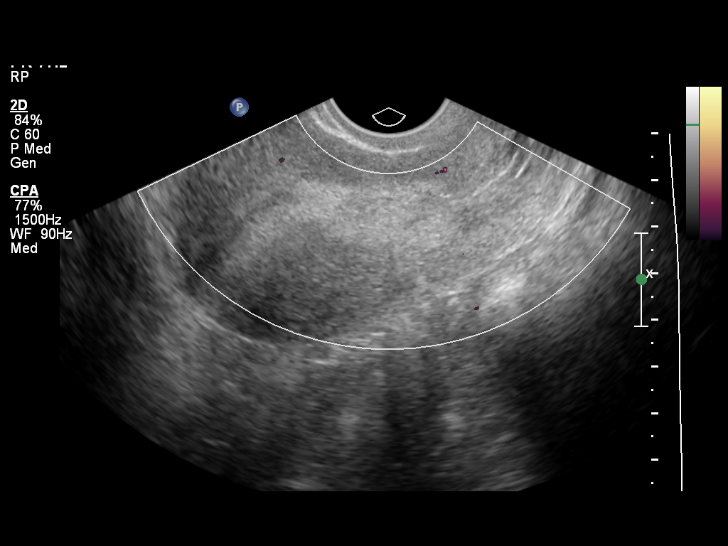
[im 26/42]
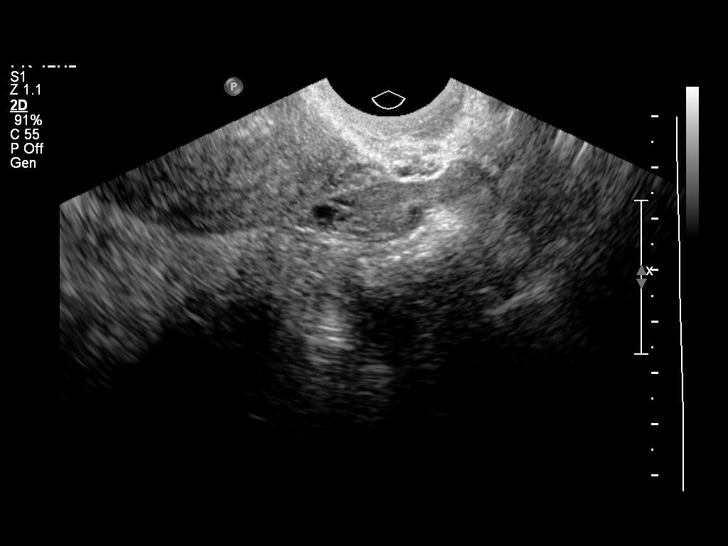
[im 29/42]
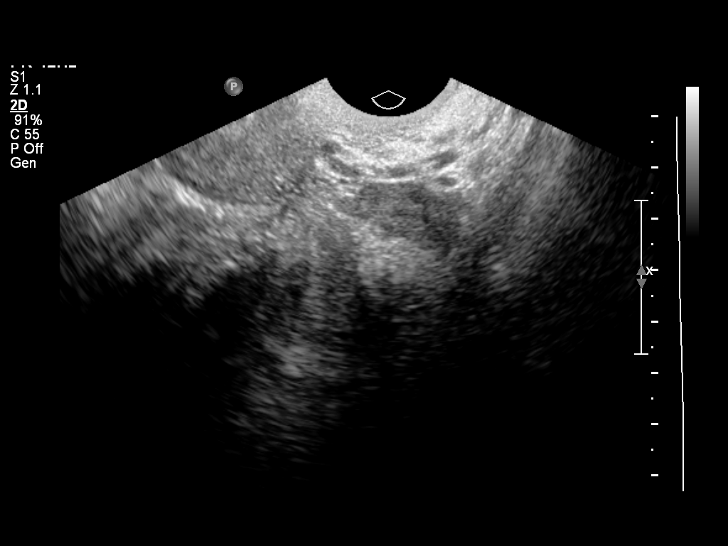
[im 32/42]
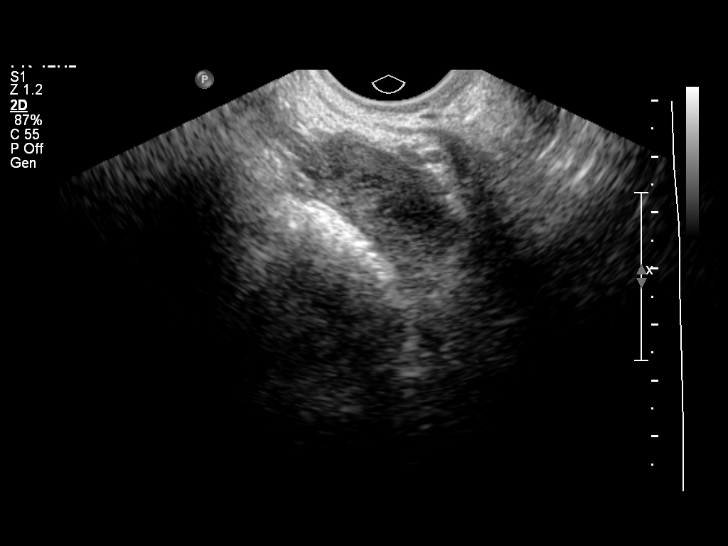
[im 35/42]
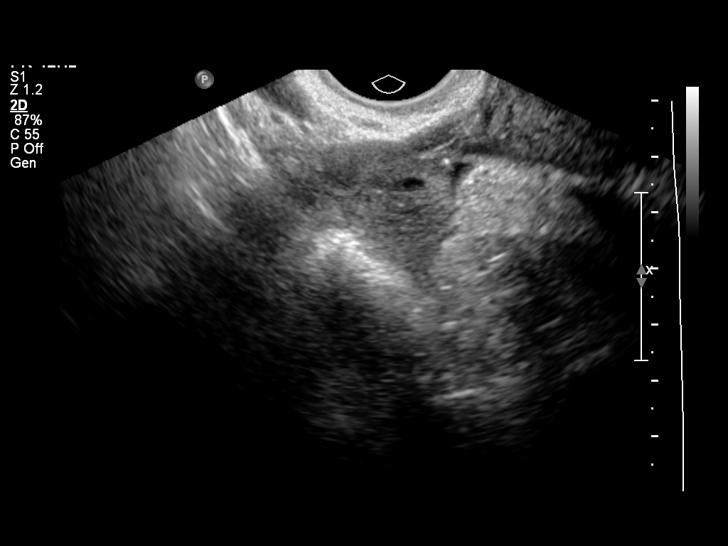
[im 38/42]
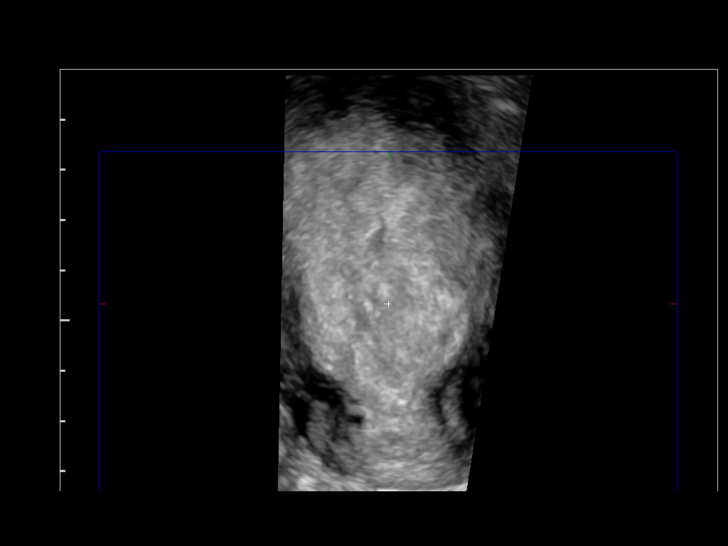
[im 42/42]
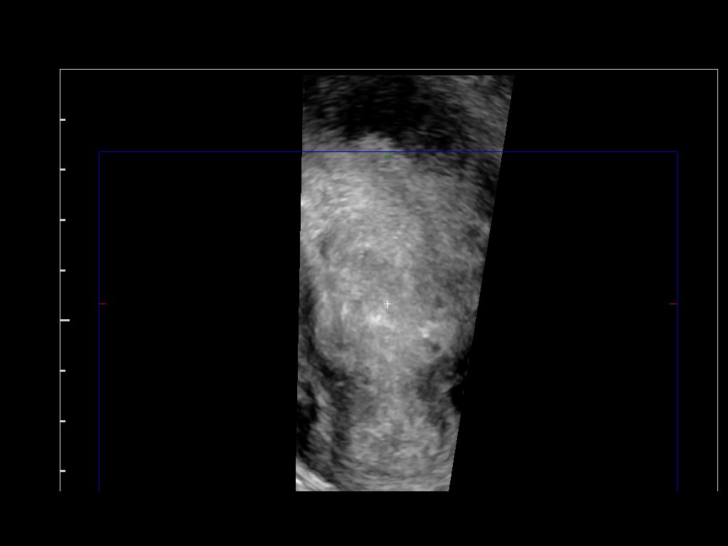

[14 of 28 positions shown; findings below may reference images not displayed]

FINDINGS: The uterus measures 5.5 x 9.6 by 6.9 cm.  The endometrium
is thickened and heterogeneous in echotexture, measuring about
cm thickness.  The previously identified endometrial gestational
sac is not visualized today.  Changes are consistent with ongoing
or interval spontaneous abortion.  Color flow Doppler images
demonstrate minimal if any flow in the endometrium, suggesting no
specific evidence of retained products of conception.  No
myometrial masses are suggested.  No free pelvic fluid collections.

The right ovary measures 3.5 by 2.6 by 2.8 cm and contains a simple
cyst measuring 1.9 x 1.4 cm.  The left ovary measures 2.7 x 1.4 by
1.9 cm.  Normal follicular changes are suggested.  No abnormal
adnexal masses.
IMPRESSION: Previously identified intrauterine gestational sac is no longer
visualized.  The endometrium is heterogeneous and thickened.
Changes are consistent with ongoing/interval spontaneous abortion.

## 2012-09-21 ENCOUNTER — Ambulatory Visit: Payer: Self-pay | Admitting: Family Medicine

## 2012-09-21 VITALS — BP 132/74 | HR 90 | Temp 98.8°F | Resp 16 | Ht 63.5 in | Wt 164.4 lb

## 2012-09-21 DIAGNOSIS — W57XXXA Bitten or stung by nonvenomous insect and other nonvenomous arthropods, initial encounter: Secondary | ICD-10-CM

## 2012-09-21 DIAGNOSIS — T148 Other injury of unspecified body region: Secondary | ICD-10-CM

## 2012-09-21 DIAGNOSIS — N912 Amenorrhea, unspecified: Secondary | ICD-10-CM

## 2012-09-21 DIAGNOSIS — Z331 Pregnant state, incidental: Secondary | ICD-10-CM

## 2012-09-21 DIAGNOSIS — B3749 Other urogenital candidiasis: Secondary | ICD-10-CM

## 2012-09-21 DIAGNOSIS — Z3490 Encounter for supervision of normal pregnancy, unspecified, unspecified trimester: Secondary | ICD-10-CM

## 2012-09-21 LAB — POCT URINE PREGNANCY: Preg Test, Ur: POSITIVE

## 2012-09-21 MED ORDER — TRIAMCINOLONE ACETONIDE 0.1 % EX CREA: TOPICAL_CREAM | Freq: Two times a day (BID) | CUTANEOUS | Status: DC

## 2012-09-21 MED ORDER — NYSTATIN 100000 UNIT/GM EX CREA: TOPICAL_CREAM | Freq: Two times a day (BID) | CUTANEOUS | Status: DC

## 2012-09-21 NOTE — Progress Notes (Signed)
[facility]:  Office Visit  Chief Complaint:  Chief Complaint  Patient presents with  . Rash    black rash  B inner thigh x dec  . Amenorrhea    LMP 08/06/12  . Possible Pregnancy    positive home pregnancy test    HPI: Molly Carter is a 22 y.o. female who complains of : 1. Rash along bilateral groin x 6 months, itchy, scratched and became discolored. She tried cream sounds like triamcinolone once in awhile and it helped some. No vaginal sxs. No urinary sxs. Wears tight clothes, sweats alots.   2. LMP 08/06/12.Not taking prenatal meds. [id number].    Past Medical History  Diagnosis Date  . No pertinent past medical history    Past Surgical History  Procedure Laterality Date  . No past surgeries    . Miscarriage  2012   History   Social History  . Marital Status: Single    Spouse Name: N/A    Number of Children: N/A  . Years of Education: N/A   Social History Main Topics  . Smoking status: Never Smoker   . Smokeless tobacco: None  . Alcohol Use: No  . Drug Use: No  . Sexually Active: Yes   Other Topics Concern  . None   Social History Narrative  . None   Family History  Problem Relation Age of Onset  . Hyperlipidemia Mother   . Arthritis Paternal Grandmother   . Hyperlipidemia Paternal Grandfather   . Heart disease Paternal Grandfather    No Known Allergies Prior to Admission medications   Medication Sig Start Date End Date Taking? Authorizing Provider  hydrOXYzine (ATARAX/VISTARIL) 25 MG tablet Take 1 tablet (25 mg total) by mouth 3 (three) times daily as needed for itching. 03/29/12   Sherren Mocha, MD  triamcinolone cream (KENALOG) 0.1 % Apply topically 2 (two) times daily. 03/29/12   Sherren Mocha, MD     ROS: The patient denies fevers, chills, night sweats, unintentional weight loss, chest pain, palpitations, wheezing, dyspnea on exertion, nausea, vomiting, abdominal pain, dysuria, hematuria, melena, numbness, weakness, or  tingling.   All other systems have been reviewed and were otherwise negative with the exception of those mentioned in the HPI and as above.    PHYSICAL EXAM: Filed Vitals:   09/21/12 1048  BP: 132/74  Pulse: 90  Temp: 98.8 F (37.1 C)  Resp: 16   Filed Vitals:   09/21/12 1048  Height: 5' 3.5" (1.613 m)  Weight: 164 lb 6.4 oz (74.571 kg)   Body mass index is 28.66 kg/(m^2).  General: Alert, no acute distress HEENT:  Normocephalic, atraumatic, oropharynx patent.  Cardiovascular:  Regular rate and rhythm, no rubs murmurs or gallops.  No Carotid bruits, radial pulse intact. No pedal edema.  Respiratory: Clear to auscultation bilaterally.  No wheezes, rales, or rhonchi.  No cyanosis, no use of accessory musculature GI: No organomegaly, abdomen is soft and non-tender, positive bowel sounds.  No masses. Skin: + candidiasis rash in groin area. Neurologic: Facial musculature symmetric. Psychiatric: Patient is appropriate throughout our interaction. Lymphatic: No cervical lymphadenopathy Musculoskeletal: Gait intact.   LABS: Results for orders placed in visit on 09/21/12  POCT URINE PREGNANCY      Result Value Range   Preg Test, Ur Positive       EKG/XRAY:   Primary read interpreted by Dr. Conley Rolls at [facility].   ASSESSMENT/PLAN: Encounter Diagnoses  Name Primary?  . Amenorrhea Yes  . Pregnant   .  Candidiasis of perineum    Refer to [facility] Department-Maternity Care. EDD is 05/13/2013 Phone number given. Also gave other sources for ob/gyn care.  Patient has no insurance.  Recommend prenatal vitamins Rx Nystatin cream Rx Triamcinolone cream F/u prn    Eulalio Reamy PHUONG, DO 09/21/2012 11:22 AM

## 2012-09-21 NOTE — Patient Instructions (Signed)
Candidiasis cutnea  (Cutaneous Candidiasis) La candidiasis cutnea es un trastorno en el que hay un desarrollo excesivo de hongos (Cndida) en la piel. Los hongos normalmente viven en la piel, pero en pequeas cantidades y no causan ningn sntoma. En ciertos casos, un mayor desarrollo de los hongos puede causar una verdadera infeccin por hongos. Este tipo de infeccin ocurre generalmente en reas de la piel que son constantemente clidas y [place], como las axilas o la ingle. El hongo es la causa ms comn de dermatitis del paal en los bebs y en personas que no pueden controlar sus movimientos intestinales (incontinencia).  CAUSAS  El hongo que causa candidiasis cutnea con ms frecuencia es Candida albicans. Las [employer] pueden aumentar el riesgo de contraer una infeccin por hongos en la piel son:   Janene Harvey.  Embarazo.  Diabetes.  Tomar antibiticos.  Tomar pldoras anticonceptivas.  Tomar corticoides.  La enfermedad tiroidea.  Una deficiencia de hierro o zinc.  Problemas del sistema inmunolgico. SNTOMAS   Zona de la piel roja e hinchada.  Bultos en la piel.  Picazn. DIAGNSTICO  El diagnstico de la candidiasis cutnea se basa generalmente en su apariencia. Podrn realizarle unos ligeros raspados en la piel que se observarn bajo un microscopio para determinar la presencia de hongos.  TRATAMIENTO  Cremas antimicticas pueden aplicarse sobre la piel infectada. En los casos graves, pueden ser necesario tomar medicamentos por va oral.  INSTRUCCIONES PARA EL CUIDADO EN EL HOGAR   Mantenga la piel limpia y [place].  Mantenga un peso saludable.  Si tiene diabetes, mantenga bajo control el nivel de Banker. SOLICITE ATENCIN MDICA DE INMEDIATO SI:   Su erupcin contina extendindose a pesar del tratamiento.  Tiene fiebre, siente escalofros o dolor abdominal. Document Released: 04/24/2011 Document Revised: 07/28/2011 [facility] Patient Information  2013 [place], [place]. Psychiatrist (Pregnancy) Si planea quedar embarazada, es una buena idea concertar una cita de preconcepcin con el mdico para poder lograr un estilo de vida saludable ante de quedar embarazada. Esto incluye dieta, peso, ejercicio, el tomar vitaminas prenatales en especial cido flico (ayuda a prevenir defectos en el cerebro y la mdula espinal), evitar el alcohol, fumar, las drogas ilegales, problemas mdicos (diabetes, convulsiones), historial familiar de problemas genticos, condiciones de trabajo e inmunizaciones. Es mejor tener conocimiento de estas cosas y Radio producer algo antes de quedar embarazada. Si est embarazada, es necesario que siga ciertas pautas para tener un beb sano. Es muy importante Education officer, environmental controles prenatales adecuados y seguir las indicaciones del profesional que la asiste. La atencin prenatal incluye toda la asistencia mdica que usted recibe antes del nacimiento del beb. Esto ayuda a Publishing copy y Superior. INSTRUCCIONES PARA EL CUIDADO DOMICILIARIO  Comience las consultas prenatales alrededor de la 12 semana de embarazo o lo antes posible. Al principio generalmente se programan cada mes. Se hacen ms frecuentes en los 2 ltimos meses antes del parto. Es importante que concurra a todas las citas con el profesional y siga sus instrucciones con Camera operator a los medicamentos que deba Chemical engineer, a la actividad fsica y a Psychologist, forensic.  Durante el embarazo debe obtener nutrientes para usted y para su beb. Consuma una dieta normal y bien balanceada. Elija alimentos como carne, pescado, [place] y otros productos lcteos, vegetales, frutas, panes integrales y Research officer, trade union cul es el aumento de [place] ideal, segn su peso y Risk analyst. Beba gran cantidad de lquidos. Trate de beber 8 vasos de lquidos por Futures trader.  El alcohol se asocia a cierto nmero de defectos del nacimiento, incluyendo el sndrome de alcoholismo fetal. Lo  mejor es evitarlo completamente El cigarrillo causa nacimientos prematuros y bebs de bajo peso al Psychologist, clinical. El consumo de alcohol y nicotina durante el embarazo tambin aumentan marcadamente la probabilidad de que el nio sea qumicamente dependiente en etapas posteriores de su vida y puede contribuir al sndrome de muerte sbita infantil (SMSI)  No consuma drogas.  Solo tome medicamentos prescriptos o de venta libre que le haya recomendado el profesional. Algunos medicamentos pueden causar problemas genticos y fsicos al beb  Las nuseas matinales pueden aliviarse si come Chiropodist en la cama. Coma dos galletitas antes de levantarse por la maana.  Las relaciones sexuales pueden continuarse hasta casi el final del embarazo, si no se presentan otros problemas como prdida prematura (antes de tiempo) de lquido amnitico, Restaurant manager, fast food vaginal, dolor durante las relaciones sexuales o dolor abdominal (en el vientre).  Practique ejercicios con regularidad. Consulte con el profesional que la asiste si no sabe con certeza si determinados ejercicios son seguros.  No utilice la baera con agua caliente, baos turcos y saunas. Estos aumentan el riesgo de sufrir un desmayo o de prdida del conocimiento, y as Runner, broadcasting/film/video usted o el beb. La natacin es un buen ejercicio. Descanse todo lo que pueda e incluya una siesta despus de almorzar siempre que le sea posible, especialmente durante el tercer trimestre.  Evite los olores y las sustancias qumicas txicas.  No use zapatos de tacones altos, podra perder el equilibrio y caer.  No levante objetos de ms de 2,5 kg. Si levanta un objeto, flexione las piernas y los muslos, y no la espalda.  Evite los viajes largos, Paramedic trimestre.  Si debe viajar fuera de la ciudad o de su estado, lleve una copia de la historia clnica. SOLICITE ATENCIN MDICA DE INMEDIATO SI:  La temperatura oral se eleva sin motivo por encima de  102 F (38.9 C) o segn le indique el profesional que lo asiste.  Tiene una prdida de lquido por la vagina. Si sospecha una ruptura de las [place], tmese la temperatura y llame al profesional para informarlo sobre esto.  Observa unas pequeas manchas o una hemorragia vaginal Notifique al profesional acerca de la cantidad y de cuntos apsitos est utilizando.  Contina teniendo nuseas y no obtiene alivio de los [employer] han [place], o vomita sangre o una sustancia similar a la borra del caf.  Presenta un dolor en la zona superior del abdomen.  Siente molestias en el ligamento redondo en la parte abdominal baja. El profesional que la asiste Hydrologist.  Siente pequeas contracciones del tero (matriz)  No siente que el beb se mueve, o percibe menos movimientos que antes.  Siente dolor al [employer].  Brett Fairy hemorragia vaginal anormal.  Tiene diarrea persistente.  Sufre una cefalea grave.  Tiene problemas visuales.  Comienza a sentir debilidad muscular.  Se siente mareada o sufre un desmayo.  Comienza a sentir falta de aire.  Siente dolor en el pecho.  Sufre dolor en la espalda que se irradia hacia la pierna y el pie.  Siente latidos cardacos irregulares o la frecuencia cardaca es muy rpida.  Aumenta excesivamente de peso en un perodo breve (2,5 kg en 3 a 5 das)  Se ve envuelta en una situacin de violencia domstica. Document Released: 02/12/2005 Document Revised: 07/28/2011 [facility] Patient Information 2013 [place], [place].

## 2012-11-09 LAB — CYTOLOGY - PAP: Pap: NEGATIVE

## 2012-11-09 LAB — OB RESULTS CONSOLE RPR: RPR: NONREACTIVE

## 2012-11-09 LAB — OB RESULTS CONSOLE ABO/RH: RH Type: POSITIVE

## 2012-11-09 LAB — OB RESULTS CONSOLE GC/CHLAMYDIA: Gonorrhea: NEGATIVE

## 2012-11-09 LAB — OB RESULTS CONSOLE HEPATITIS B SURFACE ANTIGEN: Hepatitis B Surface Ag: NEGATIVE

## 2012-11-09 LAB — OB RESULTS CONSOLE ANTIBODY SCREEN: Antibody Screen: NEGATIVE

## 2013-03-29 LAB — OB RESULTS CONSOLE GC/CHLAMYDIA: Chlamydia: NEGATIVE

## 2013-03-29 LAB — OB RESULTS CONSOLE GBS: GBS: POSITIVE

## 2013-05-02 ENCOUNTER — Inpatient Hospital Stay (HOSPITAL_COMMUNITY): Admission: AD | Admit: 2013-05-02 | Payer: Self-pay | Source: Ambulatory Visit | Admitting: Obstetrics

## 2013-05-04 ENCOUNTER — Encounter (HOSPITAL_COMMUNITY): Payer: Self-pay | Admitting: *Deleted

## 2013-05-04 ENCOUNTER — Telehealth (HOSPITAL_COMMUNITY): Payer: Self-pay | Admitting: *Deleted

## 2013-05-04 NOTE — Telephone Encounter (Signed)
Preadmission screen  

## 2013-05-04 NOTE — Telephone Encounter (Signed)
[id number] interpreter number 

## 2013-05-09 ENCOUNTER — Encounter (HOSPITAL_COMMUNITY): Payer: Self-pay

## 2013-05-09 ENCOUNTER — Encounter (HOSPITAL_COMMUNITY): Payer: Medicaid Other | Admitting: Anesthesiology

## 2013-05-09 ENCOUNTER — Inpatient Hospital Stay (HOSPITAL_COMMUNITY)
Admission: RE | Admit: 2013-05-09 | Discharge: 2013-05-11 | DRG: 775 | Disposition: A | Discharge status: Home / Self Care | Payer: Medicaid Other | Source: Ambulatory Visit | Attending: Obstetrics | Admitting: Obstetrics

## 2013-05-09 ENCOUNTER — Inpatient Hospital Stay (HOSPITAL_COMMUNITY): Payer: Medicaid Other | Admitting: Anesthesiology

## 2013-05-09 DIAGNOSIS — O99892 Other specified diseases and conditions complicating childbirth: Secondary | ICD-10-CM | POA: Diagnosis present

## 2013-05-09 DIAGNOSIS — Z22330 Carrier of Group B streptococcus: Secondary | ICD-10-CM

## 2013-05-09 DIAGNOSIS — W010XXA Fall on same level from slipping, tripping and stumbling without subsequent striking against object, initial encounter: Secondary | ICD-10-CM | POA: Diagnosis not present

## 2013-05-09 DIAGNOSIS — Z3490 Encounter for supervision of normal pregnancy, unspecified, unspecified trimester: Secondary | ICD-10-CM

## 2013-05-09 DIAGNOSIS — Y9210 Unspecified residential institution as the place of occurrence of the external cause: Secondary | ICD-10-CM | POA: Diagnosis not present

## 2013-05-09 HISTORY — DX: Other specified health status: Z78.9

## 2013-05-09 LAB — CBC
MCH: 31.4 pg (ref 26.0–34.0)
MCV: 91.1 fL (ref 78.0–100.0)
Platelets: 295 10*3/uL (ref 150–400)
RBC: 3.92 MIL/uL (ref 3.87–5.11)
RDW: 13.8 % (ref 11.5–15.5)
WBC: 14.3 10*3/uL — ABNORMAL HIGH (ref 4.0–10.5)

## 2013-05-09 LAB — TYPE AND SCREEN: Antibody Screen: NEGATIVE

## 2013-05-09 MED ORDER — TERBUTALINE SULFATE 1 MG/ML IJ SOLN: 0.2500 mg | Freq: Once | INTRAMUSCULAR | Status: AC | PRN

## 2013-05-09 MED ORDER — PENICILLIN G POTASSIUM 5000000 UNITS IJ SOLR
2.5000 10*6.[IU] | INTRAVENOUS | Status: DC
  Administered 2013-05-09 – 2013-05-10 (×3): 2.5 10*6.[IU] via INTRAVENOUS
  Filled 2013-05-09 (×7): qty 2.5

## 2013-05-09 MED ORDER — EPHEDRINE 5 MG/ML INJ
10.0000 mg | INTRAVENOUS | Status: DC | PRN
  Filled 2013-05-09: qty 4
  Filled 2013-05-09: qty 2

## 2013-05-09 MED ORDER — PHENYLEPHRINE 40 MCG/ML (10ML) SYRINGE FOR IV PUSH (FOR BLOOD PRESSURE SUPPORT)
80.0000 ug | PREFILLED_SYRINGE | INTRAVENOUS | Status: DC | PRN
  Filled 2013-05-09: qty 2
  Filled 2013-05-09: qty 10

## 2013-05-09 MED ORDER — BUTORPHANOL TARTRATE 1 MG/ML IJ SOLN
1.0000 mg | INTRAMUSCULAR | Status: DC | PRN
  Administered 2013-05-09 (×2): 1 mg via INTRAVENOUS
  Filled 2013-05-09: qty 1

## 2013-05-09 MED ORDER — PENICILLIN G POTASSIUM 5000000 UNITS IJ SOLR
5.0000 10*6.[IU] | Freq: Once | INTRAMUSCULAR | Status: DC
  Filled 2013-05-09: qty 5

## 2013-05-09 MED ORDER — DEXTROSE 5 % IV SOLN
5.0000 10*6.[IU] | Freq: Once | INTRAVENOUS | Status: AC
  Administered 2013-05-09: 5 10*6.[IU] via INTRAVENOUS
  Filled 2013-05-09: qty 5

## 2013-05-09 MED ORDER — ONDANSETRON HCL 4 MG/2ML IJ SOLN: 4.0000 mg | Freq: Four times a day (QID) | INTRAMUSCULAR | Status: DC | PRN

## 2013-05-09 MED ORDER — CITRIC ACID-SODIUM CITRATE 334-500 MG/5ML PO SOLN: 30.0000 mL | ORAL | Status: DC | PRN

## 2013-05-09 MED ORDER — EPHEDRINE 5 MG/ML INJ
10.0000 mg | INTRAVENOUS | Status: DC | PRN
  Filled 2013-05-09: qty 2

## 2013-05-09 MED ORDER — OXYTOCIN 40 UNITS IN LACTATED RINGERS INFUSION - SIMPLE MED
62.5000 mL/h | INTRAVENOUS | Status: DC
  Administered 2013-05-10: 62.5 mL/h via INTRAVENOUS

## 2013-05-09 MED ORDER — LIDOCAINE HCL (PF) 1 % IJ SOLN
INTRAMUSCULAR | Status: DC | PRN
  Administered 2013-05-09 (×2): 8 mL

## 2013-05-09 MED ORDER — DIPHENHYDRAMINE HCL 50 MG/ML IJ SOLN: 12.5000 mg | INTRAMUSCULAR | Status: DC | PRN

## 2013-05-09 MED ORDER — PHENYLEPHRINE 40 MCG/ML (10ML) SYRINGE FOR IV PUSH (FOR BLOOD PRESSURE SUPPORT)
80.0000 ug | PREFILLED_SYRINGE | INTRAVENOUS | Status: DC | PRN
  Filled 2013-05-09: qty 2

## 2013-05-09 MED ORDER — IBUPROFEN 600 MG PO TABS: 600.0000 mg | ORAL_TABLET | Freq: Four times a day (QID) | ORAL | Status: DC | PRN

## 2013-05-09 MED ORDER — OXYCODONE-ACETAMINOPHEN 5-325 MG PO TABS: 1.0000 | ORAL_TABLET | ORAL | Status: DC | PRN

## 2013-05-09 MED ORDER — MISOPROSTOL 25 MCG QUARTER TABLET
25.0000 ug | ORAL_TABLET | ORAL | Status: DC | PRN
  Administered 2013-05-09: 25 ug via VAGINAL
  Filled 2013-05-09: qty 0.25
  Filled 2013-05-09: qty 1

## 2013-05-09 MED ORDER — ACETAMINOPHEN 325 MG PO TABS
650.0000 mg | ORAL_TABLET | ORAL | Status: DC | PRN
  Administered 2013-05-10: 650 mg via ORAL
  Filled 2013-05-09: qty 2

## 2013-05-09 MED ORDER — FENTANYL 2.5 MCG/ML BUPIVACAINE 1/10 % EPIDURAL INFUSION (WH - ANES)
INTRAMUSCULAR | Status: DC | PRN
  Administered 2013-05-09: 14 mL/h via EPIDURAL

## 2013-05-09 MED ORDER — OXYTOCIN 40 UNITS IN LACTATED RINGERS INFUSION - SIMPLE MED
1.0000 m[IU]/min | INTRAVENOUS | Status: DC
  Administered 2013-05-09: 2 m[IU]/min via INTRAVENOUS
  Filled 2013-05-09: qty 1000

## 2013-05-09 MED ORDER — OXYTOCIN BOLUS FROM INFUSION: 500.0000 mL | INTRAVENOUS | Status: DC

## 2013-05-09 MED ORDER — FENTANYL 2.5 MCG/ML BUPIVACAINE 1/10 % EPIDURAL INFUSION (WH - ANES)
14.0000 mL/h | INTRAMUSCULAR | Status: DC | PRN
  Administered 2013-05-10: 14 mL/h via EPIDURAL
  Filled 2013-05-09 (×2): qty 125

## 2013-05-09 MED ORDER — PENICILLIN G POTASSIUM 5000000 UNITS IJ SOLR: 2.5000 10*6.[IU] | INTRAMUSCULAR | Status: DC

## 2013-05-09 MED ORDER — FLEET ENEMA 7-19 GM/118ML RE ENEM: 1.0000 | ENEMA | RECTAL | Status: DC | PRN

## 2013-05-09 MED ORDER — LACTATED RINGERS IV SOLN
INTRAVENOUS | Status: DC
  Administered 2013-05-09 – 2013-05-10 (×2): via INTRAVENOUS

## 2013-05-09 MED ORDER — LIDOCAINE HCL (PF) 1 % IJ SOLN
30.0000 mL | INTRAMUSCULAR | Status: DC | PRN
  Administered 2013-05-10: 30 mL via SUBCUTANEOUS
  Filled 2013-05-09 (×2): qty 30

## 2013-05-09 MED ORDER — LACTATED RINGERS IV SOLN
500.0000 mL | Freq: Once | INTRAVENOUS | Status: AC
  Administered 2013-05-09: 500 mL via INTRAVENOUS

## 2013-05-09 MED ORDER — LACTATED RINGERS IV SOLN: 500.0000 mL | INTRAVENOUS | Status: DC | PRN

## 2013-05-09 NOTE — H&P (Signed)
This is Dr. Francoise Ceo dictating the history and physical on  Molly Carter she is a 22 year old gravida 2 para 0010 at 51 weeks EDC 05/02/2013 a positive GBS for which penicillin she is brought in for induction cervix 1 cm 80% vertex -2 amniotomy performed fluid clear she received 1 Cytotec on admission and is having irregular contractions she received Pitocin at 2:30 PM today Past medical history negative Past surgical history negative Social history System review negative Physical well-developed female in no distress HEENT negative Lungs clear to P&A Heart regular rhythm no murmurs no gallops Breasts negative Lungs clear to P&A Heart Abdomen term Pelvic as described above Extremities negative and

## 2013-05-09 NOTE — Anesthesia Preprocedure Evaluation (Signed)
Anesthesia Evaluation  Patient identified by MRN, date of birth, ID band Patient awake    Reviewed: Allergy & Precautions, H&P , Patient's Chart, lab work & pertinent test results  Airway Mallampati: II TM Distance: >3 FB Neck ROM: full    Dental no notable dental hx.    Pulmonary neg pulmonary ROS,    Pulmonary exam normal       Cardiovascular negative cardio ROS      Neuro/Psych negative neurological ROS  negative psych ROS   GI/Hepatic negative GI ROS, Neg liver ROS,   Endo/Other  negative endocrine ROS  Renal/GU negative Renal ROS  negative genitourinary   Musculoskeletal negative musculoskeletal ROS (+)   Abdominal Normal abdominal exam  (+)   Peds  Hematology negative hematology ROS (+)   Anesthesia Other Findings   Reproductive/Obstetrics (+) Pregnancy                           Anesthesia Physical Anesthesia Plan  ASA: II  Anesthesia Plan: Epidural   Post-op Pain Management:    Induction:   Airway Management Planned:   Additional Equipment:   Intra-op Plan:   Post-operative Plan:   Informed Consent: I have reviewed the patients History and Physical, chart, labs and discussed the procedure including the risks, benefits and alternatives for the proposed anesthesia with the patient or authorized representative who has indicated his/her understanding and acceptance.     Plan Discussed with:   Anesthesia Plan Comments:         Anesthesia Quick Evaluation

## 2013-05-09 NOTE — Progress Notes (Signed)
Care temporarily turned over to care coordinator d/t going into a delivery.

## 2013-05-09 NOTE — Anesthesia Procedure Notes (Signed)
Epidural Patient location during procedure: OB Start time: 05/09/2013 7:58 PM End time: 05/09/2013 8:02 PM  Staffing Anesthesiologist: Leilani Able Performed by: anesthesiologist   Preanesthetic Checklist Completed: patient identified, surgical consent, pre-op evaluation, timeout performed, IV checked, risks and benefits discussed and monitors and equipment checked  Epidural Patient position: sitting Prep: site prepped and draped and DuraPrep Patient monitoring: continuous pulse ox and blood pressure Approach: midline Injection technique: LOR air  Needle:  Needle type: Tuohy  Needle gauge: 17 G Needle length: 9 cm and 9 Needle insertion depth: 6 cm Catheter type: closed end flexible Catheter size: 19 Gauge Catheter at skin depth: 11 cm Test dose: negative and Other  Assessment Sensory level: T9 Events: blood not aspirated, injection not painful, no injection resistance, negative IV test and no paresthesia  Additional Notes Reason for block:procedure for pain

## 2013-05-10 ENCOUNTER — Encounter (HOSPITAL_COMMUNITY): Payer: Self-pay

## 2013-05-10 MED ORDER — LANOLIN HYDROUS EX OINT: TOPICAL_OINTMENT | CUTANEOUS | Status: DC | PRN

## 2013-05-10 MED ORDER — ONDANSETRON HCL 4 MG PO TABS: 4.0000 mg | ORAL_TABLET | ORAL | Status: DC | PRN

## 2013-05-10 MED ORDER — BENZOCAINE-MENTHOL 20-0.5 % EX AERO
1.0000 "application " | INHALATION_SPRAY | CUTANEOUS | Status: DC | PRN
  Administered 2013-05-10: 1 via TOPICAL
  Filled 2013-05-10: qty 56

## 2013-05-10 MED ORDER — OXYCODONE-ACETAMINOPHEN 5-325 MG PO TABS
1.0000 | ORAL_TABLET | ORAL | Status: DC | PRN
  Administered 2013-05-10 – 2013-05-11 (×2): 1 via ORAL
  Filled 2013-05-10 (×2): qty 1

## 2013-05-10 MED ORDER — DIPHENHYDRAMINE HCL 25 MG PO CAPS: 25.0000 mg | ORAL_CAPSULE | Freq: Four times a day (QID) | ORAL | Status: DC | PRN

## 2013-05-10 MED ORDER — SENNOSIDES-DOCUSATE SODIUM 8.6-50 MG PO TABS
2.0000 | ORAL_TABLET | ORAL | Status: DC
  Administered 2013-05-11: 2 via ORAL
  Filled 2013-05-10: qty 2

## 2013-05-10 MED ORDER — ONDANSETRON HCL 4 MG/2ML IJ SOLN: 4.0000 mg | INTRAMUSCULAR | Status: DC | PRN

## 2013-05-10 MED ORDER — TETANUS-DIPHTH-ACELL PERTUSSIS 5-2.5-18.5 LF-MCG/0.5 IM SUSP
0.5000 mL | Freq: Once | INTRAMUSCULAR | Status: AC
  Administered 2013-05-11: 0.5 mL via INTRAMUSCULAR

## 2013-05-10 MED ORDER — IBUPROFEN 600 MG PO TABS
600.0000 mg | ORAL_TABLET | Freq: Four times a day (QID) | ORAL | Status: DC
  Administered 2013-05-10 – 2013-05-11 (×5): 600 mg via ORAL
  Filled 2013-05-10 (×5): qty 1

## 2013-05-10 MED ORDER — ZOLPIDEM TARTRATE 5 MG PO TABS: 5.0000 mg | ORAL_TABLET | Freq: Every evening | ORAL | Status: DC | PRN

## 2013-05-10 MED ORDER — SIMETHICONE 80 MG PO CHEW: 80.0000 mg | CHEWABLE_TABLET | ORAL | Status: DC | PRN

## 2013-05-10 MED ORDER — FERROUS SULFATE 325 (65 FE) MG PO TABS
325.0000 mg | ORAL_TABLET | Freq: Two times a day (BID) | ORAL | Status: DC
  Administered 2013-05-10 (×2): 325 mg via ORAL
  Filled 2013-05-10 (×2): qty 1

## 2013-05-10 MED ORDER — PRENATAL MULTIVITAMIN CH
1.0000 | ORAL_TABLET | Freq: Every day | ORAL | Status: DC
  Administered 2013-05-10: 1 via ORAL
  Filled 2013-05-10: qty 1

## 2013-05-10 MED ORDER — DIBUCAINE 1 % RE OINT: 1.0000 "application " | TOPICAL_OINTMENT | RECTAL | Status: DC | PRN

## 2013-05-10 MED ORDER — WITCH HAZEL-GLYCERIN EX PADS: 1.0000 "application " | MEDICATED_PAD | CUTANEOUS | Status: DC | PRN

## 2013-05-10 NOTE — Anesthesia Postprocedure Evaluation (Signed)
Anesthesia Post Note  Patient: Molly Carter  Procedure(s) Performed: * No procedures listed *  Anesthesia type: Epidural  Patient location: Mother/Baby  Post pain: Pain level controlled  Post assessment: Post-op Vital signs reviewed  Last Vitals: BP 111/73  Pulse 89  Temp(Src) 36.4 C (Oral)  Resp 16  Ht 5\' 3"  (1.6 m)  Wt 185 lb (83.915 kg)  BMI 32.78 kg/m2  SpO2 99%  Post vital signs: Reviewed  Level of consciousness: awake  Complications: No apparent anesthesia complications

## 2013-05-10 NOTE — Progress Notes (Signed)
Ana, Interpreter, present to help with admission of pt to Mother-Baby... Information packet given, orientation to room,  and all questions answered with the help of interpreter. Pt is aware that interpreter services are available should the need arise... Will continue to monitor pt.

## 2013-05-10 NOTE — Progress Notes (Signed)
Arrived to room and pt on the floor of the bathroom. Pt's husband stated she slipped and fell. An employee was in the pt's room during the time of the fall. Pt helped back to bed. Will call interpreter. Called Dr. Gaynell Face and informed him of pt's fall. No new orders.

## 2013-05-10 NOTE — Progress Notes (Signed)
Patient ID: Molly Carter, female   DOB: 05/05/1991, 22 y.o.   MRN: [id number] Patient is on 18 milliunits of Pitocin cervix 5 cm 100% vertex -1 IUPC inserted doing well

## 2013-05-10 NOTE — Progress Notes (Signed)
Post-fall huddle completed. Safety zone portal completed. Pt understands to call for help until able to walk steadily in room. Interpreter with [place] for post-fall huddle.

## 2013-05-10 NOTE — Progress Notes (Signed)
UR completed 

## 2013-05-10 NOTE — Lactation Note (Signed)
This note was copied from the chart of Molly Shacola Schussler. Lactation Consultation Note  Patient Name: Molly Carter [id number] Date: 05/10/2013 Reason for consult: Initial assessment Mom reports some mild nipple tenderness. She plans to breast and bottle feed. BF basics reviewed. Advised to BF with each feeding before giving any bottles. Guidelines for supplementing with BF reviewed with Mom. Advised Mom of risk of early supplementation to BF success. Lactation brochure left for review. Advised of OP services and support group. Encouraged Mom to call for assist with latch at next feeding due to nipple tenderness. Alex, the Spanish interpreter present for visit.   Maternal Data Formula Feeding for Exclusion: Yes Reason for exclusion: Mother's choice to formula and breast feed on admission Infant to breast within first hour of birth: Yes Has patient been taught Hand Expression?: Yes Does the patient have breastfeeding experience prior to this delivery?: No  Feeding Feeding Type: Breast Fed Length of feed: 15 min  LATCH Score/Interventions                      Lactation Tools Discussed/Used     Consult Status Consult Status: Follow-up Date: 05/11/13 Follow-up type: In-patient    Alfred Levins 05/10/2013, 3:29 PM

## 2013-05-11 LAB — CBC
Hemoglobin: 10.3 g/dL — ABNORMAL LOW (ref 12.0–15.0)
MCH: 30.7 pg (ref 26.0–34.0)
MCV: 92 fL (ref 78.0–100.0)
Platelets: 264 10*3/uL (ref 150–400)
RBC: 3.36 MIL/uL — ABNORMAL LOW (ref 3.87–5.11)
WBC: 11.1 10*3/uL — ABNORMAL HIGH (ref 4.0–10.5)

## 2013-05-11 MED ORDER — INFLUENZA VAC SPLIT QUAD 0.5 ML IM SUSP
0.5000 mL | INTRAMUSCULAR | Status: AC
  Administered 2013-05-11: 0.5 mL via INTRAMUSCULAR

## 2013-05-11 NOTE — Discharge Summary (Signed)
Obstetric Discharge Summary Reason for Admission: onset of labor and induction of labor Prenatal Procedures: none Intrapartum Procedures: spontaneous vaginal delivery Postpartum Procedures: none Complications-Operative and Postpartum: none Hemoglobin  Date Value Range Status  05/11/2013 10.3* 12.0 - 15.0 g/dL Final     HCT  Date Value Range Status  05/11/2013 30.9* 36.0 - 46.0 % Final    Physical Exam:  General: alert Lochia: appropriate Uterine Fundus: firm Incision: healing well DVT Evaluation: No evidence of DVT seen on physical exam.  Discharge Diagnoses: Term Pregnancy-delivered  Discharge Information: Date: 05/11/2013 Activity: pelvic rest Diet: routine Medications: Percocet Condition: improved Instructions: refer to practice specific booklet Discharge to: home Follow-up Information   Follow up with Kathreen Cosier, MD.   Specialty:  Obstetrics and Gynecology   Contact information:   [address] [place] [address] [phone]       Newborn Data: Live born female  Birth Weight: 7 lb 7.9 oz (3399 g) APGAR: 9, 9  Home with mother.  Lauralee Waters A 05/11/2013, 6:39 AM

## 2014-03-20 ENCOUNTER — Encounter (HOSPITAL_COMMUNITY): Payer: Self-pay

## 2016-04-05 ENCOUNTER — Ambulatory Visit (INDEPENDENT_AMBULATORY_CARE_PROVIDER_SITE_OTHER): Payer: Self-pay | Admitting: Family Medicine

## 2016-04-05 VITALS — BP 126/78 | HR 126 | Temp 97.8°F | Resp 17 | Ht 64.5 in | Wt 175.0 lb

## 2016-04-05 DIAGNOSIS — M62838 Other muscle spasm: Secondary | ICD-10-CM

## 2016-04-05 DIAGNOSIS — M7712 Lateral epicondylitis, left elbow: Secondary | ICD-10-CM

## 2016-04-05 DIAGNOSIS — S56912A Strain of unspecified muscles, fascia and tendons at forearm level, left arm, initial encounter: Secondary | ICD-10-CM

## 2016-04-05 MED ORDER — CYCLOBENZAPRINE HCL 10 MG PO TABS: 10.0000 mg | ORAL_TABLET | Freq: Three times a day (TID) | ORAL | 0 refills | Status: DC | PRN

## 2016-04-05 MED ORDER — IBUPROFEN 600 MG PO TABS: 600.0000 mg | ORAL_TABLET | Freq: Three times a day (TID) | ORAL | 0 refills | Status: DC | PRN

## 2016-04-05 NOTE — Progress Notes (Signed)
   [name] is a 25 y.o. female who presents to [facility] today for Left arm and shoulder pain:  1.  Left arm and shoulder pain:   Started on Wednesday. On Tuesday she had lifted a large amount of groceries and carried them in her left arm. She also spent the entire afternoon raking leaves. She is right-handed and uses mostly her left hand to press down and rake the leaves. She awoke with soreness in her forearm and left shoulder pain on Wednesday. This is gradually improved but is still present and therefore she is presenting to care today.  She does very little exercise otherwise. No numbness and tingling. No weakness. Good sensation in her hands. No diaphoresis. No shortness of breath. No exertional chest pain. She has tried Advil 1 on Wednesday without much relief and hasn't tried anything else since then.  ROS as above.    PMH reviewed. Patient is a nonsmoker.   Past Medical History:  Diagnosis Date  . Medical history non-contributory   . No pertinent past medical history    Past Surgical History:  Procedure Laterality Date  . miscarriage  2012  . NO PAST SURGERIES      Medications reviewed. No current outpatient prescriptions on file.   No current facility-administered medications for this visit.      Physical Exam:  BP 126/78 (BP Location: Right Arm, Patient Position: Sitting, Cuff Size: Normal)   Pulse (!) 126   Temp 97.8 F (36.6 C) (Oral)   Resp 17   Ht 5' 4.5" (1.638 m)   Wt 175 lb (79.4 kg)   SpO2 100%   BMI 29.57 kg/m  Gen:  Alert, cooperative patient who appears stated age in no acute distress.  Vital signs reviewed. HEENT: EOMI,  MMM Neck:  Fully mobile and supple.  No pain with movement Pulm:  Clear to auscultation bilaterally with good air movement.  No wheezes or rales noted.   Cardiac:  Regular rate and rhythm without murmur auscultated.  Good S1/S2. MSK:  Tenderness to trapezius muscle on the left. Rest of shoulder  exam is normal. Shoulder range of motion testing is negative for reproduction of pain. Bicep and upper arm negative for palpation and tenderness. She does have tenderness directly over the lateral epicondyles of her left arm. Some mild tenderness along the extensor muscles of her forearm. No weakness or tenderness in her hands or wrists. Strength is 5 out of 5 bilateral handgrip and forearm strength and sensation. Neurovascularly intact.  Assessment and Plan:  1.  Left forearm strain: - Directly over the lateral epicondyle. -Treat with muscle relaxer and analgesic/anti-inflammatory.  #2. Trapezius muscle strain: - no red flags.   -Posterior morning #2 secondary to overuse from raking leaves and carrying heavy groceries on left hand side. -She is right-handed as above. -Heat and massage for relief as well. -Follow-up the next several days if no improvement.

## 2016-04-05 NOTE — Patient Instructions (Addendum)
  You have a muscle spasm in your arm and shoulder. This will get better over time. It will also get better with heat and massage likely talked about.  Take the ibuprofen 600 mg every 6-8 hours if needed for pain relief.  You can also take the Flexeril which is a muscle relaxer up to 3 times a day which will help with pain relief as well.  If you're not having any improvement in the next several days, can see us. If it starts to get worse, don't wait and come back immediately.    It was good to meet you today!   IF you received an x-ray today, you will receive an invoice from [facility]. Please contact [facility] at [phone] with questions or concerns regarding your invoice.   IF you received labwork today, you will receive an invoice from [facility]. Please contact [facility] at [phone] with questions or concerns regarding your invoice.   Our billing staff will not be able to assist you with questions regarding bills from these companies.  You will be contacted with the lab results as soon as they are available. The fastest way to get your results is to activate your My Chart account. Instructions are located on the last page of this paperwork. If you have not heard from us regarding the results in 2 weeks, please contact this office.

## 2017-10-30 ENCOUNTER — Encounter: Payer: Self-pay | Admitting: Family Medicine

## 2017-10-30 ENCOUNTER — Ambulatory Visit: Payer: Self-pay | Admitting: Family Medicine

## 2017-10-30 ENCOUNTER — Other Ambulatory Visit: Payer: Self-pay

## 2017-10-30 VITALS — BP 138/86 | HR 80 | Temp 98.3°F | Ht 64.09 in | Wt 165.0 lb

## 2017-10-30 DIAGNOSIS — N926 Irregular menstruation, unspecified: Secondary | ICD-10-CM

## 2017-10-30 NOTE — Progress Notes (Signed)
   6/14/20196:04 PM  Molly Carter 09/08/1990, 27 y.o. female [id number]  Chief Complaint  Patient presents with  . Menstrual Problem    for the pat yr, has been having prolonged menstrual cycles, starting to get worse and stay loger that normal days of flow    HPI:   Patient is a 27 y.o. female who presents today for concerns for prolonged menses  She reports that for the past year her period has been getting longer, now lasting about 10 days Not significantly heavy nor painful Towards the end mostly spotting [id number] She has been trying to conceive for past 8 months Neg breast lumps or nipple discharge Neg vaginal discharge, pelvic pain, dyspareunia, abnormal vaginal bleeding Neg swelling of neck, changes in temperature tolerance, mood, bowel movements, hair or nails Neg for easy bruising and problems with clotting.  Fall Risk  10/30/2017 04/05/2016  Falls in the past year? No No     [facility] 10/30/2017 04/05/2016  Decreased Interest 0 0  Down, Depressed, Hopeless 0 0  PHQ - 2 Score 0 0    No Known Allergies  Prior to Admission medications   Not on File    Past Medical History:  Diagnosis Date  . Medical history non-contributory   . No pertinent past medical history     Past Surgical History:  Procedure Laterality Date  . miscarriage  2012  . NO PAST SURGERIES      Social History   Tobacco Use  . Smoking status: Never Smoker  . Smokeless tobacco: Never Used  Substance Use Topics  . Alcohol use: No    Family History  Problem Relation Age of Onset  . Hyperlipidemia Mother   . Heart disease Maternal Grandfather   . Hyperlipidemia Maternal Grandfather   . Alcohol abuse Maternal Grandfather     ROS Per hpi  OBJECTIVE:  Blood pressure 138/86, pulse 80, temperature 98.3 F (36.8 C), temperature source Oral, height 5' 4.09" (1.628 m), weight 165 lb (74.8 kg), SpO2 99 %, unknown if currently breastfeeding.  Physical Exam    Constitutional: She is oriented to person, place, and time. She appears well-developed and well-nourished.  HENT:  Head: Normocephalic and atraumatic.  Mouth/Throat: Oropharynx is clear and moist. No oropharyngeal exudate.  Eyes: Pupils are equal, round, and reactive to light. Conjunctivae and EOM are normal. No scleral icterus.  Neck: Neck supple. No thyromegaly present.  Cardiovascular: Normal rate, regular rhythm and normal heart sounds. Exam reveals no gallop and no friction rub.  No murmur heard. Pulmonary/Chest: Effort normal and breath sounds normal. She has no wheezes. She has no rales.  Musculoskeletal: She exhibits no edema.  Neurological: She is alert and oriented to person, place, and time.  Skin: Skin is warm and dry.  Psychiatric: She has a normal mood and affect.  Nursing note and vitals reviewed.   UPT - negative  ASSESSMENT and PLAN  1. Irregular menses - POCT urine pregnancy - TSH - CBC - amb ref to ob/gyn  Return in about 2 weeks (around 11/13/2017) for pap.    Myles LippsIrma M Santiago, MD Primary Care at [facility] [address] [place], [place] [address] Ph.  [phone] Fax [phone]

## 2017-10-30 NOTE — Patient Instructions (Addendum)
     IF you received an x-ray today, you will receive an invoice from [facility]. Please contact [facility] at [phone] with questions or concerns regarding your invoice.   IF you received labwork today, you will receive an invoice from [facility]. Please contact [facility] at [phone] with questions or concerns regarding your invoice.   Our billing staff will not be able to assist you with questions regarding bills from these companies.  You will be contacted with the lab results as soon as they are available. The fastest way to get your results is to activate your My Chart account. Instructions are located on the last page of this paperwork. If you have not heard from us regarding the results in 2 weeks, please contact this office.     Metrorragia (Metrorrhagia) La metrorragia es una hemorragia anormal procedente del tero. Generalmente, la hemorragia se presenta entre una menstruacin y la siguiente, y ocurre con Psychologist, clinicalfrecuencia. CUIDADOS EN EL HOGAR Est atenta a los cambios en los sntomas. Estas indicaciones pueden ayudarla con el trastorno: Comidas  Consuma muchos tipos de alimentos.  Consuma los alimentos que contienen el nutriente llamado hierro. Algunos alimentos que contienen hierro son los siguientes: ? Hgado. ? Carne. ? Mariscos. ? Verduras de hojas verdes. ? Huevos.  Si tiene problemas para defecar (estreimiento): ? Beba abundante agua. ? Consuma frutas y verduras con alto contenido de Olneyfibra, como espinaca, zanahorias, frambuesas, manzanas y mango. Medicamentos  Baxter Internationalome los medicamentos de venta libre y los recetados solamente como se lo haya indicado el mdico.  No haga cambie los medicamentos sin hablar con el mdico.  No tome aspirina ni medicamentos que la contengan: ? Durante la semana previa a Tax adviserla menstruacin. ? Durante la Brink's Companymenstruacin.  Tome los comprimidos de hierro Administrator, artsexactamente como se lo haya indicado el mdico. Actividad  Debe  cambiarse el apsito o el tampn ms de una vez en un lapso de 2 horas. ? Acustese con los pies elevados. ? Colquese una compresa fra en la parte baja del vientre (abdomen). ? Descanse todo lo que pueda.  No trate de Management consultantadelgazar hasta que la hemorragia se detenga y los niveles de hierro en la sangre se normalicen. Otras indicaciones  MetLifeDurante dos meses, anote lo siguiente: ? La fecha de comienzo de Tax adviserla menstruacin. ? La fecha de su finalizacin. ? Cuando tenga hemorragias que no ocurran Environmental consultantdurante la menstruacin. ? Los problemas que advierte.  Concurra a todas las visitas de control como se lo haya indicado el mdico. Esto es importante. SOLICITE AYUDA SI:  Siente mareos.  Siente como si se fuera a desmayar.  Se siente dbil.  Siente malestar estomacal (nuseas).  Vomita.  No puede comer o tomar lquido sin vomitar.  Siente mareos Mohawk Industriesmientras toma los medicamentos.  Tiene heces acuosas (diarrea) mientras toma los medicamentos.  Desea cambiar los anticonceptivos o las hormonas que toma.  Desea dejar de tomar los anticonceptivos o las hormonas. SOLICITE AYUDA DE INMEDIATO SI:  Tiene fiebre.  Tiene escalofros.  Debe cambiarse el apsito o el tampn ms de una vez en Davisuna hora.  Tiene una hemorragia vaginal que es ms abundante que antes.  Elimina cogulos de sangre por la vagina.  Tiene dolor en el vientre.  Se desmaya.  Tiene una erupcin cutnea. Esta informacin no tiene Theme park managercomo fin reemplazar el consejo del mdico. Asegrese de hacerle al mdico cualquier pregunta que tenga. Document Released: 11/04/2011 Document Revised: 01/24/2015 Document Reviewed: 07/31/2014 Elsevier Interactive Patient Education  [employer]2018 Elsevier Inc.

## 2017-10-31 ENCOUNTER — Ambulatory Visit (INDEPENDENT_AMBULATORY_CARE_PROVIDER_SITE_OTHER): Payer: Self-pay | Admitting: Family Medicine

## 2017-10-31 DIAGNOSIS — N926 Irregular menstruation, unspecified: Secondary | ICD-10-CM

## 2017-10-31 LAB — POCT URINE PREGNANCY: Preg Test, Ur: NEGATIVE

## 2017-10-31 NOTE — Progress Notes (Signed)
Lab only visit 

## 2017-11-01 LAB — CBC
Hematocrit: 41.8 % (ref 34.0–46.6)
Hemoglobin: 14.1 g/dL (ref 11.1–15.9)
MCH: 31.8 pg (ref 26.6–33.0)
MCHC: 33.7 g/dL (ref 31.5–35.7)
MCV: 94 fL (ref 79–97)
Platelets: 354 10*3/uL (ref 150–450)
RBC: 4.44 x10E6/uL (ref 3.77–5.28)
RDW: 12.8 % (ref 12.3–15.4)
WBC: 8.5 10*3/uL (ref 3.4–10.8)

## 2017-11-01 LAB — TSH: TSH: 2.88 u[IU]/mL (ref 0.450–4.500)

## 2017-11-13 ENCOUNTER — Other Ambulatory Visit: Payer: Self-pay

## 2017-11-13 ENCOUNTER — Ambulatory Visit: Payer: Self-pay | Admitting: Family Medicine

## 2017-11-13 ENCOUNTER — Encounter: Payer: Self-pay | Admitting: Family Medicine

## 2017-11-13 VITALS — BP 118/64 | HR 97 | Temp 98.4°F | Ht 65.0 in | Wt 165.2 lb

## 2017-11-13 DIAGNOSIS — N926 Irregular menstruation, unspecified: Secondary | ICD-10-CM

## 2017-11-13 NOTE — Progress Notes (Signed)
   6/28/20199:27 AM  Georg RuddleViririana Hernandez-Perez 07/25/1990, 27 y.o. female [id number]  Chief Complaint  Patient presents with  . Follow-up    irregular menses, still having irregular bleeding with some cramping.    HPI:   Patient is a 27 y.o. female with past medical history significant for irregular period who presents today for followup on labs  Normal CBC and TSH Periods continue to be about 10 days long about twice a month Desires fertility Due for a pap, self pay  Fall Risk  11/13/2017 10/30/2017 04/05/2016  Falls in the past year? No No No     Dep[facility] screen [facility] 2[facility] 11/13/2017 10/30/2017 04/05/2016  Decreased Interest 0 0 0  Down, Depressed, Hopeless 0 0 0  PHQ - 2 Score 0 0 0    No Known Allergies  Prior to Admission medications   Not on File    Past Medical History:  Diagnosis Date  . Medical history non-contributory   . No pertinent past medical history     Past Surgical History:  Procedure Laterality Date  . miscarriage  2012  . NO PAST SURGERIES      Social History   Tobacco Use  . Smoking status: Never Smoker  . Smokeless tobacco: Never Used  Substance Use Topics  . Alcohol use: No    Family History  Problem Relation Age of Onset  . Hyperlipidemia Mother   . Heart disease Maternal Grandfather   . Hyperlipidemia Maternal Grandfather   . Alcohol abuse Maternal Grandfather     ROS Per hpi  OBJECTIVE:  Blood pressure 118/64, pulse 97, temperature 98.4 F (36.9 C), temperature source Oral, height 5\' 5"  (1.651 m), weight 165 lb 3.2 oz (74.9 kg), SpO2 100 %, unknown if currently breastfeeding.  Physical Exam  Constitutional: She is oriented to person, place, and time. She appears well-developed and well-nourished.  HENT:  Head: Normocephalic and atraumatic.  Mouth/Throat: Mucous membranes are normal.  Eyes: Pupils are equal, round, and reactive to light. EOM are normal. No scleral icterus.  Neck: Neck supple.  Pulmonary/Chest: Effort  normal.  Neurological: She is alert and oriented to person, place, and time.  Skin: Skin is warm and dry.  Psychiatric: She has a normal mood and affect.  Nursing note and vitals reviewed.   ASSESSMENT and PLAN  1. Irregular menses - Ambulatory referral to Obstetrics / Gynecology - provided [facility] info for [facility] for [facility]  Return if symptoms worsen or fail to improve.    Myles LippsIrma M Santiago, MD Primary Care at [facility] [address] [place], [place] [address] Ph.  [phone] Fax [phone]

## 2017-11-13 NOTE — Patient Instructions (Addendum)
1. [facility] [address]  [place]  [address]  Main: [phone]   2. [place]: 1. [place], Fonnie MuChristine Brannock, Coordinator, [phone] 2. [place] [facility], Orlie PollenKim Lookabill, Cordinator, [phone]   IF you received an x-ray today, you will receive an invoice from [facility]. Please contact [facility] at [phone] with questions or concerns regarding your invoice.   IF you received labwork today, you will receive an invoice from [facility]. Please contact [facility] at [phone] with questions or concerns regarding your invoice.   Our billing staff will not be able to assist you with questions regarding bills from these companies.  You will be contacted with the lab results as soon as they are available. The fastest way to get your results is to activate your My Chart account. Instructions are located on the last page of this paperwork. If you have not heard from us regarding the results in 2 weeks, please contact this office.

## 2018-05-07 ENCOUNTER — Ambulatory Visit (HOSPITAL_COMMUNITY)
Admission: EM | Admit: 2018-05-07 | Discharge: 2018-05-07 | Disposition: A | Discharge status: Home / Self Care | Payer: Self-pay | Attending: Internal Medicine | Admitting: Internal Medicine

## 2018-05-07 ENCOUNTER — Encounter (HOSPITAL_COMMUNITY): Payer: Self-pay | Admitting: Emergency Medicine

## 2018-05-07 DIAGNOSIS — N3000 Acute cystitis without hematuria: Secondary | ICD-10-CM

## 2018-05-07 DIAGNOSIS — N760 Acute vaginitis: Secondary | ICD-10-CM

## 2018-05-07 LAB — POCT URINALYSIS DIP (DEVICE)
BILIRUBIN URINE: NEGATIVE
Glucose, UA: NEGATIVE mg/dL
KETONES UR: NEGATIVE mg/dL
Nitrite: NEGATIVE
Protein, ur: NEGATIVE mg/dL
Specific Gravity, Urine: 1.015 (ref 1.005–1.030)
Urobilinogen, UA: 0.2 mg/dL (ref 0.0–1.0)
pH: 6.5 (ref 5.0–8.0)

## 2018-05-07 MED ORDER — NITROFURANTOIN MONOHYD MACRO 100 MG PO CAPS: 100.0000 mg | ORAL_CAPSULE | Freq: Two times a day (BID) | ORAL | 0 refills | Status: AC

## 2018-05-07 MED ORDER — METRONIDAZOLE 500 MG PO TABS: 500.0000 mg | ORAL_TABLET | Freq: Two times a day (BID) | ORAL | 0 refills | Status: AC

## 2018-05-07 MED ORDER — FLUCONAZOLE 150 MG PO TABS
150.0000 mg | ORAL_TABLET | ORAL | 0 refills | Status: AC
Start: 2018-05-07 — End: 2018-05-14

## 2018-05-07 NOTE — Discharge Instructions (Signed)
Take all medications as prescribed and drink plenty of fluids. We will call you with the results of the other tests. The need for further treatment will depend on those results.

## 2018-05-07 NOTE — ED Triage Notes (Signed)
Pt reports vaginal irritation and clear d/c onset 7-10 days associated w/dysuria  Initially thought it was due to her to begin her menstrual but menstrual ended on 12/13 - 12/16  Sexually active w/husband  Has applied OTC vag suppository for yeast w/temp relief.   A&O x4... NAD.Marland Kitchen.. ambulatory

## 2018-05-07 NOTE — ED Provider Notes (Signed)
[facility]    CSN: [id number] Arrival date & time: 05/07/18  [id number]     History   Chief Complaint Chief Complaint  Patient presents with  . Vaginitis    HPI Molly Carter is a 27 y.o. female.   Patient is spanish speaking. Translation provided by Gerilyn Nestleamon, RN.   Subjective:   Molly Carter is a 27 y.o. female who presents for evaluation of vaginal symptoms of itching, vaginal swelling with local irritation and copious, white and green vaginal discharge. Symptoms have been present for about a week but has been worse over the past 3 days. She denies any dysuria, abdominal pain, nausea, vomiting, chills, back pain or flank pain. Her menses ended around the 16th of this month. She is married and is currently trying to conceive. She reports monogamy on her part but is unsure about her husband but later stated that she doubtful of any STD exposures.   The following portions of the patient's history were reviewed and updated as appropriate: allergies, current medications, past family history, past medical history, past social history, past surgical history and problem list.          Past Medical History:  Diagnosis Date  . Medical history non-contributory   . No pertinent past medical history     Patient Active Problem List   Diagnosis Date Noted  . NVD (normal vaginal delivery) 05/10/2013  . Pregnancy 05/09/2013    Past Surgical History:  Procedure Laterality Date  . miscarriage  2012  . NO PAST SURGERIES      OB History    Gravida  2   Para  1   Term  1   Preterm      AB  1   Living  1     SAB  1   TAB      Ectopic      Multiple      Live Births  1            Home Medications    Prior to Admission medications   Medication Sig Start Date End Date Taking? Authorizing Provider  fluconazole (DIFLUCAN) 150 MG tablet Take 1 tablet (150 mg total) by mouth every 3 (three) days for 3 doses. 05/07/18 05/14/18   Lurline IdolMurrill, Matthe Sloane, FNP  metroNIDAZOLE (FLAGYL) 500 MG tablet Take 1 tablet (500 mg total) by mouth 2 (two) times daily. 05/07/18   Lurline IdolMurrill, Rockie Vawter, FNP  nitrofurantoin, macrocrystal-monohydrate, (MACROBID) 100 MG capsule Take 1 capsule (100 mg total) by mouth 2 (two) times daily. 05/07/18   Lurline IdolMurrill, Tyrina Hines, FNP    Family History Family History  Problem Relation Age of Onset  . Hyperlipidemia Mother   . Heart disease Maternal Grandfather   . Hyperlipidemia Maternal Grandfather   . Alcohol abuse Maternal Grandfather     Social History Social History   Tobacco Use  . Smoking status: Never Smoker  . Smokeless tobacco: Never Used  Substance Use Topics  . Alcohol use: No  . Drug use: No     Allergies   Patient has no known allergies.   Review of Systems Review of Systems  Constitutional: Negative for chills and fever.  Gastrointestinal: Negative.   Genitourinary: Positive for vaginal discharge and vaginal pain. Negative for dysuria, flank pain and genital sores.  Musculoskeletal: Negative for back pain.  Neurological: Negative.   All other systems reviewed and are negative.    Physical Exam Triage Vital Signs ED Triage Vitals [05/07/18 1101]  Enc Vitals Group  BP      Pulse      Resp      Temp      Temp src      SpO2      Weight      Height      Head Circumference      Peak Flow      Pain Score 0     Pain Loc      Pain Edu?      Excl. in GC?    No data found.  Updated Vital Signs LMP 05/03/2018   Breastfeeding No   Visual Acuity Right Eye Distance:   Left Eye Distance:   Bilateral Distance:    Right Eye Near:   Left Eye Near:    Bilateral Near:     Physical Exam Constitutional:      Appearance: Normal appearance.  Neck:     Musculoskeletal: Normal range of motion and neck supple.  Cardiovascular:     Rate and Rhythm: Normal rate and regular rhythm.     Pulses: Normal pulses.     Heart sounds: Normal heart sounds.  Abdominal:      General: Abdomen is flat. Bowel sounds are normal.     Palpations: Abdomen is soft.     Tenderness: There is no abdominal tenderness. There is no right CVA tenderness or left CVA tenderness.  Genitourinary:    Exam position: Lithotomy position.     Labia:        Right: Tenderness present. No rash, lesion or injury.        Left: Tenderness present. No rash, lesion or injury.      Vagina: Vaginal discharge present. No erythema, tenderness, bleeding or lesions.     Cervix: Normal.     Uterus: Normal.      Adnexa: Right adnexa normal and left adnexa normal.       Right: No tenderness.         Left: No tenderness.       Comments: labia minora swelling noted. Moderate amount of thick yellow discharge noted in vaginal vault.  Musculoskeletal: Normal range of motion.  Skin:    General: Skin is warm.  Neurological:     General: No focal deficit present.     Mental Status: She is alert and oriented to person, place, and time.  Psychiatric:        Mood and Affect: Mood normal.        Behavior: Behavior normal.      UC Treatments / Results  Labs (all labs ordered are listed, but only abnormal results are displayed) Labs Reviewed  POCT URINALYSIS DIP (DEVICE) - Abnormal; Notable for the following components:      Result Value   Hgb urine dipstick TRACE (*)    Leukocytes, UA LARGE (*)    All other components within normal limits  CERVICOVAGINAL ANCILLARY ONLY    EKG None  Radiology No results found.  Procedures Procedures (including critical care time)  Medications Ordered in UC Medications - No data to display  Initial Impression / Assessment and Plan / UC Course  I have reviewed the triage vital signs and the nursing notes.  Pertinent labs & imaging results that were available during my care of the patient were reviewed by me and considered in my medical decision making (see chart for details).    27 year old female presenting with vaginal itching/swelling with local  irritation and vaginal discharge.  Is alert and oriented x3.  Vital signs stable.  Nontoxic-appearing.  Vaginal exam reveals labial swelling and a moderate amount of thick yellow discharge in the vaginal vault. Urine dipstick shows trace for hemoglobin and large amount of leukocyte esterase.  Nuswab pending for gonorrhea, chlamydia, trichomonas, BV and yeast.  Will treat with Macrobid, Flagyl and Diflucan for now. Patient would like to wait on the results of the STD testing to determine if further treatment is needed.  Today's evaluation has revealed no signs of a dangerous process. Discussed diagnosis with patient. Patient aware of their diagnosis, possible red flag symptoms to watch out for and need for close follow up. Patient understands verbal and written discharge instructions. Patient comfortable with plan and disposition.  Patient has a clear mental status at this time, good insight into illness (after discussion and teaching) and has clear judgment to make decisions regarding their care.  Documentation was completed with the aid of voice recognition software. Transcription may contain typographical errors.   Final Clinical Impressions(s) / UC Diagnoses   Final diagnoses:  Acute cystitis without hematuria  Vaginitis and vulvovaginitis     Discharge Instructions     Take all medications as prescribed and drink plenty of fluids. We will call you with the results of the other tests. The need for further treatment will depend on those results.     ED Prescriptions    Medication Sig Dispense Auth. Provider   nitrofurantoin, macrocrystal-monohydrate, (MACROBID) 100 MG capsule Take 1 capsule (100 mg total) by mouth 2 (two) times daily. 10 capsule Lurline Idol, FNP   metroNIDAZOLE (FLAGYL) 500 MG tablet Take 1 tablet (500 mg total) by mouth 2 (two) times daily. 14 tablet Lurline Idol, FNP   fluconazole (DIFLUCAN) 150 MG tablet Take 1 tablet (150 mg total) by mouth every 3 (three)  days for 3 doses. 3 tablet Lurline Idol, FNP     Controlled Substance Prescriptions [place] Controlled Substance Registry consulted? Not Applicable   Lurline Idol, FNP 05/07/18 1155

## 2018-05-08 LAB — URINE CULTURE: Culture: >=100000 — AB

## 2018-05-10 ENCOUNTER — Telehealth (HOSPITAL_COMMUNITY): Payer: Self-pay | Admitting: Emergency Medicine

## 2018-05-10 ENCOUNTER — Ambulatory Visit: Payer: Self-pay | Admitting: Family Medicine

## 2018-05-10 LAB — CERVICOVAGINAL ANCILLARY ONLY
Bacterial vaginitis: NEGATIVE
CHLAMYDIA, DNA PROBE: NEGATIVE
Candida vaginitis: NEGATIVE
Neisseria Gonorrhea: NEGATIVE
Trichomonas: NEGATIVE

## 2018-05-10 MED ORDER — PENICILLIN V POTASSIUM 500 MG PO TABS: 500.0000 mg | ORAL_TABLET | Freq: Two times a day (BID) | ORAL | 0 refills | Status: AC

## 2018-05-10 NOTE — Telephone Encounter (Signed)
Spoke with Dr. Dayton ScrapeMurray, swab results all negative, to change antibiotic to penicillin. Attempted to reach patient, no answer, voicemail left. Prescription sent.

## 2018-05-10 NOTE — Telephone Encounter (Signed)
Called patient with spanish interpreter to see how shes doing, pt states shes feeling better. Pending swab, told patient if she hasn't heard from us by next week and still not feeling well, to return. Pt agreeable to plan.

## 2018-05-13 ENCOUNTER — Telehealth (HOSPITAL_COMMUNITY): Payer: Self-pay | Admitting: Emergency Medicine

## 2018-05-13 NOTE — Telephone Encounter (Signed)
Spoke with pt using spanish interpretor to get PCN rx; pt sts feeling much better
